# Patient Record
Sex: Female | Born: 1980 | Race: Black or African American | Hispanic: No | Marital: Single | Smoking: Never smoker
Health system: Southern US, Community
[De-identification: ages and names within clinical notes are randomized; demographics above are authoritative.]

## PROBLEM LIST (undated history)

## (undated) DIAGNOSIS — Z9889 Other specified postprocedural states: Secondary | ICD-10-CM

## (undated) DIAGNOSIS — N979 Female infertility, unspecified: Secondary | ICD-10-CM

## (undated) DIAGNOSIS — Z789 Other specified health status: Secondary | ICD-10-CM

## (undated) DIAGNOSIS — I10 Essential (primary) hypertension: Secondary | ICD-10-CM

## (undated) DIAGNOSIS — E559 Vitamin D deficiency, unspecified: Secondary | ICD-10-CM

## (undated) DIAGNOSIS — R7302 Impaired glucose tolerance (oral): Secondary | ICD-10-CM

## (undated) DIAGNOSIS — G43909 Migraine, unspecified, not intractable, without status migrainosus: Secondary | ICD-10-CM

## (undated) DIAGNOSIS — F329 Major depressive disorder, single episode, unspecified: Secondary | ICD-10-CM

## (undated) DIAGNOSIS — G4730 Sleep apnea, unspecified: Secondary | ICD-10-CM

## (undated) DIAGNOSIS — L989 Disorder of the skin and subcutaneous tissue, unspecified: Secondary | ICD-10-CM

## (undated) DIAGNOSIS — N939 Abnormal uterine and vaginal bleeding, unspecified: Secondary | ICD-10-CM

## (undated) DIAGNOSIS — R7303 Prediabetes: Secondary | ICD-10-CM

## (undated) DIAGNOSIS — R112 Nausea with vomiting, unspecified: Secondary | ICD-10-CM

## (undated) DIAGNOSIS — F419 Anxiety disorder, unspecified: Secondary | ICD-10-CM

## (undated) DIAGNOSIS — T8859XA Other complications of anesthesia, initial encounter: Secondary | ICD-10-CM

## (undated) HISTORY — DX: Vitamin D deficiency, unspecified: E55.9

## (undated) HISTORY — DX: Female infertility, unspecified: N97.9

## (undated) HISTORY — PX: DILATION AND CURETTAGE OF UTERUS: SHX78

## (undated) HISTORY — PX: BREAST SURGERY: SHX581

## (undated) HISTORY — PX: REDUCTION MAMMAPLASTY: SUR839

## (undated) HISTORY — PX: PELVIC LAPAROSCOPY: SHX162

## (undated) HISTORY — DX: Migraine, unspecified, not intractable, without status migrainosus: G43.909

## (undated) HISTORY — PX: NM MISC PROCEDURE: HXRAD923

---
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
## 1999-05-08 ENCOUNTER — Other Ambulatory Visit: Admission: RE | Admit: 1999-05-08 | Discharge: 1999-05-08 | Payer: Self-pay | Admitting: *Deleted

## 2009-10-12 HISTORY — PX: HERNIA REPAIR: SHX51

## 2012-10-12 DIAGNOSIS — N809 Endometriosis, unspecified: Secondary | ICD-10-CM

## 2012-10-12 HISTORY — DX: Endometriosis, unspecified: N80.9

## 2016-01-13 ENCOUNTER — Ambulatory Visit (HOSPITAL_COMMUNITY): Payer: Self-pay

## 2016-06-17 ENCOUNTER — Ambulatory Visit: Payer: Self-pay

## 2019-10-02 ENCOUNTER — Ambulatory Visit: Payer: Self-pay

## 2019-10-02 NOTE — Telephone Encounter (Signed)
Pt. Has a new pt. Appointment in January, but is requesting to be seen sooner if possible for a hernia. States she had hernia repair 2011. Over the weekend lifted some boxes and has pain and has noticed "a bulge" , midline and above her belly button. Please advise.Thanks.  Answer Assessment - Initial Assessment Questions 1. LOCATION: "Where does it hurt?"      Midline above belly button to the left 2. RADIATION: "Does the pain shoot anywhere else?" (e.g., chest, back)      No 3. ONSET: "When did the pain begin?" (e.g., minutes, hours or days ago)      3 days ago 4. SUDDEN: "Gradual or sudden onset?"     Sudden 5. PATTERN "Does the pain come and go, or is it constant?"    - If constant: "Is it getting better, staying the same, or worsening?"      (Note: Constant means the pain never goes away completely; most serious pain is constant and it progresses)     - If intermittent: "How long does it last?" "Do you have pain now?"     (Note: Intermittent means the pain goes away completely between bouts)     Comes and goes 6. SEVERITY: "How bad is the pain?"  (e.g., Scale 1-10; mild, moderate, or severe)   - MILD (1-3): doesn't interfere with normal activities, abdomen soft and not tender to touch    - MODERATE (4-7): interferes with normal activities or awakens from sleep, tender to touch    - SEVERE (8-10): excruciating pain, doubled over, unable to do any normal activities      Now - 6 7. RECURRENT SYMPTOM: "Have you ever had this type of abdominal pain before?" If so, ask: "When was the last time?" and "What happened that time?"      Yes 8. CAUSE: "What do you think is causing the abdominal pain?"     Hernia 9. RELIEVING/AGGRAVATING FACTORS: "What makes it better or worse?" (e.g., movement, antacids, bowel movement)     Eating makes it worse 10. OTHER SYMPTOMS: "Has there been any vomiting, diarrhea, constipation, or urine problems?"       Bulge in same area 11. PREGNANCY: "Is there any  chance you are pregnant?" "When was your last menstrual period?"       No  Protocols used: ABDOMINAL PAIN - [facility]

## 2019-10-02 NOTE — Telephone Encounter (Signed)
Spoke with patient. Advised with this type of issue she needs to seek medical attention at a UC. Advised her policy is that we don't see new patient for acute issues before their new patient appointment. Patient verbalized understanding and will go to UC.

## 2019-10-12 ENCOUNTER — Encounter (HOSPITAL_COMMUNITY): Payer: Self-pay

## 2019-10-12 ENCOUNTER — Other Ambulatory Visit: Payer: Self-pay

## 2019-10-12 ENCOUNTER — Other Ambulatory Visit (HOSPITAL_COMMUNITY)
Admission: RE | Admit: 2019-10-12 | Discharge: 2019-10-12 | Disposition: A | Discharge status: Home / Self Care | Payer: Managed Care, Other (non HMO) | Source: Ambulatory Visit | Attending: Surgery | Admitting: Surgery

## 2019-10-12 ENCOUNTER — Encounter (HOSPITAL_COMMUNITY)
Admission: RE | Admit: 2019-10-12 | Discharge: 2019-10-12 | Disposition: A | Discharge status: Home / Self Care | Payer: Managed Care, Other (non HMO) | Source: Ambulatory Visit | Attending: Surgery | Admitting: Surgery

## 2019-10-12 DIAGNOSIS — Z01818 Encounter for other preprocedural examination: Secondary | ICD-10-CM | POA: Diagnosis not present

## 2019-10-12 DIAGNOSIS — R9431 Abnormal electrocardiogram [ECG] [EKG]: Secondary | ICD-10-CM | POA: Diagnosis not present

## 2019-10-12 DIAGNOSIS — K439 Ventral hernia without obstruction or gangrene: Secondary | ICD-10-CM | POA: Diagnosis not present

## 2019-10-12 DIAGNOSIS — Z20828 Contact with and (suspected) exposure to other viral communicable diseases: Secondary | ICD-10-CM | POA: Diagnosis not present

## 2019-10-12 HISTORY — DX: Sleep apnea, unspecified: G47.30

## 2019-10-12 HISTORY — DX: Essential (primary) hypertension: I10

## 2019-10-12 HISTORY — DX: Prediabetes: R73.03

## 2019-10-12 LAB — BASIC METABOLIC PANEL
Anion gap: 9 (ref 5–15)
BUN: 11 mg/dL (ref 6–20)
CO2: 27 mmol/L (ref 22–32)
Calcium: 9.1 mg/dL (ref 8.9–10.3)
Chloride: 101 mmol/L (ref 98–111)
Creatinine, Ser: 0.6 mg/dL (ref 0.44–1.00)
GFR calc Af Amer: >60 mL/min (ref >60)
GFR calc non Af Amer: >60 mL/min (ref >60)
Glucose, Bld: 80 mg/dL (ref 70–99)
Potassium: 3.6 mmol/L (ref 3.5–5.1)
Sodium: 137 mmol/L (ref 135–145)

## 2019-10-12 LAB — CBC
HCT: 38.5 % (ref 36.0–46.0)
Hemoglobin: 12.6 g/dL (ref 12.0–15.0)
MCH: 31.1 pg (ref 26.0–34.0)
MCHC: 32.7 g/dL (ref 30.0–36.0)
MCV: 95.1 fL (ref 80.0–100.0)
Platelets: 360 10*3/uL (ref 150–400)
RBC: 4.05 MIL/uL (ref 3.87–5.11)
RDW: 14.6 % (ref 11.5–15.5)
WBC: 7.8 10*3/uL (ref 4.0–10.5)
nRBC: 0 % (ref 0.0–0.2)

## 2019-10-12 NOTE — Progress Notes (Signed)
PCP - Dr. Virl Cagey from [place] [facility] -   Chest x-ray -  EKG -  Stress Test -  ECHO -  Cardiac Cath -   Sleep Study -  CPAP -   Fasting Blood Sugar -  Checks Blood Sugar _____ times a day  Blood Thinner Instructions:N/A Aspirin Instructions: Last Dose:  Anesthesia review:   Patient denies shortness of breath, fever, cough and chest pain at PAT appointment   Patient verbalized understanding of instructions that were given to them at the PAT appointment. Patient was also instructed that they will need to review over the PAT instructions again at home before surgery.

## 2019-10-12 NOTE — Progress Notes (Signed)
Please place orders in epic for upcoming surgery . TY

## 2019-10-12 NOTE — Patient Instructions (Addendum)
DUE TO COVID-19 ONLY ONE VISITOR IS ALLOWED TO COME WITH YOU AND STAY IN THE WAITING ROOM ONLY DURING PRE OP AND PROCEDURE DAY OF SURGERY. THE 1 VISITOR MAY VISIT WITH YOU AFTER SURGERY IN YOUR PRIVATE ROOM DURING VISITING HOURS ONLY!  YOU NEED TO HAVE A COVID 19 TEST ON; 10/12/19 @___10 :45 AM____, THIS TEST MUST BE DONE BEFORE SURGERY, COME  [place], [address] , [id number].  ([place]) ONCE YOUR COVID TEST IS COMPLETED, PLEASE BEGIN THE QUARANTINE INSTRUCTIONS AS OUTLINED IN YOUR HANDOUT.                Angelica Young   Your procedure is scheduled on: 10/15/18   Report to [facility] Main  Entrance   Report to admitting at: 8:00 AM     Call this number if you have problems the morning of surgery [phone]    Remember: Do not eat food or drink liquids :After Midnight.  BRUSH YOUR TEETH MORNING OF SURGERY AND RINSE YOUR MOUTH OUT, NO CHEWING GUM CANDY OR MINTS.     Take these medicines the morning of surgery with A SIP OF WATER: fLUOXETINE                                 You may not have any metal on your body including hair pins and              piercings  Do not wear jewelry, make-up, lotions, powders or perfumes, deodorant             Do not wear nail polish on your fingernails.  Do not shave  48 hours prior to surgery.              Men may shave face and neck.   Do not bring valuables to the hospital. [place].  Contacts, dentures or bridgework may not be worn into surgery.  Leave suitcase in the car. After surgery it may be brought to your room.     Patients discharged the day of surgery will not be allowed to drive home. IF YOU ARE HAVING SURGERY AND GOING HOME THE SAME DAY, YOU MUST HAVE AN ADULT TO DRIVE YOU HOME AND BE WITH YOU FOR 24 HOURS. YOU MAY GO HOME BY TAXI OR UBER OR ORTHERWISE, BUT AN ADULT MUST ACCOMPANY YOU HOME AND STAY WITH YOU FOR 24 HOURS.  Name and phone number of  your driver:  Special Instructions: N/A              Please read over the following fact sheets you were given: _____________________________________________________________________             [facility] - Preparing for Surgery Before surgery, you can play an important role.  Because skin is not sterile, your skin needs to be as free of germs as possible.  You can reduce the number of germs on your skin by washing with CHG (chlorahexidine gluconate) soap before surgery.  CHG is an antiseptic cleaner which kills germs and bonds with the skin to continue killing germs even after washing. Please DO NOT use if you have an allergy to CHG or antibacterial soaps.  If your skin becomes reddened/irritated stop using the CHG and inform your nurse when you arrive at Short Stay. Do  not shave (including legs and underarms) for at least 48 hours prior to the first CHG shower.  You may shave your face/neck. Please follow these instructions carefully:  1.  Shower with CHG Soap the night before surgery and the  morning of Surgery.  2.  If you choose to wash your hair, wash your hair first as usual with your  normal  shampoo.  3.  After you shampoo, rinse your hair and body thoroughly to remove the  shampoo.                           4.  Use CHG as you would any other liquid soap.  You can apply chg directly  to the skin and wash                       Gently with a scrungie or clean washcloth.  5.  Apply the CHG Soap to your body ONLY FROM THE NECK DOWN.   Do not use on face/ open                           Wound or open sores. Avoid contact with eyes, ears mouth and genitals (private parts).                       Wash face,  Genitals (private parts) with your normal soap.             6.  Wash thoroughly, paying special attention to the area where your surgery  will be performed.  7.  Thoroughly rinse your body with warm water from the neck down.  8.  DO NOT shower/wash with your normal soap after using and  rinsing off  the CHG Soap.                9.  Pat yourself dry with a clean towel.            10.  Wear clean pajamas.            11.  Place clean sheets on your bed the night of your first shower and do not  sleep with pets. Day of Surgery : Do not apply any lotions/deodorants the morning of surgery.  Please wear clean clothes to the hospital/surgery center.  FAILURE TO FOLLOW THESE INSTRUCTIONS MAY RESULT IN THE CANCELLATION OF YOUR SURGERY PATIENT SIGNATURE_________________________________  NURSE SIGNATURE__________________________________  ________________________________________________________________________

## 2019-10-13 LAB — NOVEL CORONAVIRUS, NAA (HOSP ORDER, SEND-OUT TO REF LAB; TAT 18-24 HRS): SARS-CoV-2, NAA: NOT DETECTED

## 2019-10-15 ENCOUNTER — Ambulatory Visit: Payer: Self-pay | Admitting: Surgery

## 2019-10-15 NOTE — H&P (Signed)
Angelica Young Documented: 10/04/2019 2:34 PM Location: [place] Office Patient #: [id number] DOB: Mar 12, 1981 Married / Language: English / Race: Black or African American Female   History of Present Illness Molli Hazard B. Daphine Deutscher MD; 10/04/2019 3:13 PM) The patient is a 39 year old female who presents with an umbilical hernia. 39 year old patient of [facility] presents with painful umbilcal/ventral hernia. She had laparoscopy in 2011 for endometriosis. She had a small hernia that they closed primarily at that time. She got Covid in October and coughed for a month. She has been moving locally and carrying boxes she has developed pain and pinching in her supraumbilical region.   I described lap assisted ventral hernia repair with mesh. Will schedule as an outpatient at [facility] OR.    Past Surgical History Doristine Devoid, CMA; 10/04/2019 2:35 PM) Laparoscopic Inguinal Hernia Surgery  Left. Mammoplasty; Reduction  Bilateral.  Diagnostic Studies History Doristine Devoid, CMA; 10/04/2019 2:35 PM) Mammogram  within last year Pap Smear  1-5 years ago  Allergies Doristine Devoid, CMA; 10/04/2019 2:35 PM) Sulfa Antibiotics   Medication History Doristine Devoid, CMA; 10/04/2019 2:37 PM) Verapamil HCl (180MG  Capsule ER, Oral) Active. Chlorthalidone (25MG  Tablet, Oral) Active. FLUoxetine HCl (40MG  Capsule, Oral) Active. Medications Reconciled  Social History , CMA; 10/04/2019 2:35 PM) Alcohol use  Occasional alcohol use. Caffeine use  Coffee. No drug use  Tobacco use  Never smoker.  Family History , CMA; 10/04/2019 2:35 PM) Cerebrovascular Accident  Father. Heart disease in female family member before age 51  Hypertension  Father. Migraine Headache  Mother.  Pregnancy / Birth History Doristine Devoid, CMA; 10/04/2019 2:35 PM) Age at menarche  12 years. Contraceptive History  Oral contraceptives. Gravida   2 Maternal age  2-40 Para  0 Regular periods   Other Problems Doristine Devoid, CMA; 10/04/2019 2:35 PM) High blood pressure  Migraine Headache  Umbilical Hernia Repair     Review of Systems (Chemira Jones CMA; 10/04/2019 2:35 PM) General Not Present- Appetite Loss, Chills, Fatigue, Fever, Night Sweats, Weight Gain and Weight Loss. Skin Not Present- Change in Wart/Mole, Dryness, Hives, Jaundice, New Lesions, Non-Healing Wounds, Rash and Ulcer. HEENT Not Present- Earache, Hearing Loss, Hoarseness, Nose Bleed, Oral Ulcers, Ringing in the Ears, Seasonal Allergies, Sinus Pain, Sore Throat, Visual Disturbances, Wears glasses/contact lenses and Yellow Eyes. Respiratory Not Present- Bloody sputum, Chronic Cough, Difficulty Breathing, Snoring and Wheezing. Breast Not Present- Breast Mass, Breast Pain, Nipple Discharge and Skin Changes. Cardiovascular Not Present- Chest Pain, Difficulty Breathing Lying Down, Leg Cramps, Palpitations, Rapid Heart Rate, Shortness of Breath and Swelling of Extremities. Gastrointestinal Present- Abdominal Pain, Bloating and Gets full quickly at meals. Not Present- Bloody Stool, Change in Bowel Habits, Chronic diarrhea, Constipation, Difficulty Swallowing, Excessive gas, Hemorrhoids, Indigestion, Nausea, Rectal Pain and Vomiting. Female Genitourinary Not Present- Frequency, Nocturia, Painful Urination, Pelvic Pain and Urgency. Musculoskeletal Not Present- Back Pain, Joint Pain, Joint Stiffness, Muscle Pain, Muscle Weakness and Swelling of Extremities. Neurological Not Present- Decreased Memory, Fainting, Headaches, Numbness, Seizures, Tingling, Tremor, Trouble walking and Weakness. Psychiatric Not Present- Anxiety, Bipolar, Change in Sleep Pattern, Depression, Fearful and Frequent crying. Endocrine Not Present- Cold Intolerance, Excessive Hunger, Hair Changes, Heat Intolerance, Hot flashes and New Diabetes. Hematology Not Present- Blood Thinners, Easy Bruising,  Excessive bleeding, Gland problems, HIV and Persistent Infections.  Vitals (Chemira Jones CMA; 10/04/2019 2:35 PM) 10/04/2019 2:35 PM Weight: 195 lb Height: 62in Body Surface Area: 1.89 m Body Mass Index: 35.67 kg/m  Pulse: 90 (Regular)  BP: 140/80 (Sitting, Left Arm, Standard)       Physical Exam (Davin Muramoto B. Hassell Done MD; 10/04/2019 3:14 PM) General Note: WDWN AAF NAD HEENT unremarkable Neck supple Chest clear Heart SR Abdomen mildly obese with soft reducible ventral hernia just above the umbilicus. Defect a little large than fingertip Ext FROM     Assessment & Plan Rodman Key B. Hassell Done MD; 10/04/2019 3:15 PM) VENTRAL HERNIA (K43.9) Impression: She wants to move ahead with ventral hernia repair. I described that we would need to place mesh in the defect. Will schedule at [facility] main.  Signed by Pedro Earls, MD (10/04/2019 3:17 PM)

## 2019-10-16 ENCOUNTER — Ambulatory Visit (HOSPITAL_COMMUNITY): Payer: Managed Care, Other (non HMO) | Admitting: Physician Assistant

## 2019-10-16 ENCOUNTER — Ambulatory Visit (HOSPITAL_COMMUNITY)
Admission: RE | Admit: 2019-10-16 | Discharge: 2019-10-16 | Disposition: A | Discharge status: Home / Self Care | Payer: Managed Care, Other (non HMO) | Attending: Surgery | Admitting: Surgery

## 2019-10-16 ENCOUNTER — Encounter (HOSPITAL_COMMUNITY): Payer: Self-pay | Admitting: Surgery

## 2019-10-16 ENCOUNTER — Encounter (HOSPITAL_COMMUNITY)
Admission: RE | Disposition: A | Discharge status: Home / Self Care | Payer: Self-pay | Source: Home / Self Care | Attending: Surgery

## 2019-10-16 ENCOUNTER — Ambulatory Visit (HOSPITAL_COMMUNITY): Payer: Managed Care, Other (non HMO) | Admitting: Anesthesiology

## 2019-10-16 DIAGNOSIS — Z820 Family history of epilepsy and other diseases of the nervous system: Secondary | ICD-10-CM | POA: Diagnosis not present

## 2019-10-16 DIAGNOSIS — Z8249 Family history of ischemic heart disease and other diseases of the circulatory system: Secondary | ICD-10-CM | POA: Diagnosis not present

## 2019-10-16 DIAGNOSIS — Z823 Family history of stroke: Secondary | ICD-10-CM | POA: Insufficient documentation

## 2019-10-16 DIAGNOSIS — G4730 Sleep apnea, unspecified: Secondary | ICD-10-CM | POA: Diagnosis not present

## 2019-10-16 DIAGNOSIS — Z882 Allergy status to sulfonamides status: Secondary | ICD-10-CM | POA: Insufficient documentation

## 2019-10-16 DIAGNOSIS — I10 Essential (primary) hypertension: Secondary | ICD-10-CM | POA: Diagnosis not present

## 2019-10-16 DIAGNOSIS — R030 Elevated blood-pressure reading, without diagnosis of hypertension: Secondary | ICD-10-CM | POA: Diagnosis not present

## 2019-10-16 DIAGNOSIS — G43909 Migraine, unspecified, not intractable, without status migrainosus: Secondary | ICD-10-CM | POA: Insufficient documentation

## 2019-10-16 DIAGNOSIS — K436 Other and unspecified ventral hernia with obstruction, without gangrene: Secondary | ICD-10-CM | POA: Insufficient documentation

## 2019-10-16 HISTORY — PX: LAPAROSCOPIC ASSISTED VENTRAL HERNIA REPAIR: SHX6312

## 2019-10-16 LAB — PREGNANCY, URINE: Preg Test, Ur: NEGATIVE

## 2019-10-16 SURGERY — REPAIR, HERNIA, VENTRAL, LAPAROSCOPY-ASSISTED
Anesthesia: General | Site: Abdomen

## 2019-10-16 MED ORDER — ONDANSETRON HCL 4 MG/2ML IJ SOLN
INTRAMUSCULAR | Status: DC | PRN
  Administered 2019-10-16: 4 mg via INTRAVENOUS

## 2019-10-16 MED ORDER — LIDOCAINE 2% (20 MG/ML) 5 ML SYRINGE
INTRAMUSCULAR | Status: DC | PRN
  Administered 2019-10-16: 60 mg via INTRAVENOUS

## 2019-10-16 MED ORDER — CHLORHEXIDINE GLUCONATE CLOTH 2 % EX PADS: 6.0000 | MEDICATED_PAD | Freq: Once | CUTANEOUS | Status: DC

## 2019-10-16 MED ORDER — PROMETHAZINE HCL 25 MG/ML IJ SOLN: 6.2500 mg | INTRAMUSCULAR | Status: DC | PRN

## 2019-10-16 MED ORDER — HYDROMORPHONE HCL 1 MG/ML IJ SOLN
0.2500 mg | INTRAMUSCULAR | Status: DC | PRN
  Administered 2019-10-16 (×2): 0.5 mg via INTRAVENOUS

## 2019-10-16 MED ORDER — LACTATED RINGERS IV SOLN: INTRAVENOUS | Status: DC | PRN

## 2019-10-16 MED ORDER — SODIUM CHLORIDE (PF) 0.9 % IJ SOLN
INTRAMUSCULAR | Status: AC
  Filled 2019-10-16: qty 10

## 2019-10-16 MED ORDER — HYDROCODONE-ACETAMINOPHEN 5-325 MG PO TABS: 1.0000 | ORAL_TABLET | Freq: Four times a day (QID) | ORAL | 0 refills | Status: DC | PRN

## 2019-10-16 MED ORDER — 0.9 % SODIUM CHLORIDE (POUR BTL) OPTIME
TOPICAL | Status: DC | PRN
  Administered 2019-10-16: 1000 mL

## 2019-10-16 MED ORDER — SCOPOLAMINE 1 MG/3DAYS TD PT72
1.0000 | MEDICATED_PATCH | TRANSDERMAL | Status: DC
  Administered 2019-10-16: 1.5 mg via TRANSDERMAL
  Filled 2019-10-16: qty 1

## 2019-10-16 MED ORDER — CEFAZOLIN SODIUM-DEXTROSE 2-4 GM/100ML-% IV SOLN
2.0000 g | INTRAVENOUS | Status: AC
  Administered 2019-10-16: 11:00:00 2 g via INTRAVENOUS
  Filled 2019-10-16: qty 100

## 2019-10-16 MED ORDER — ROCURONIUM BROMIDE 10 MG/ML (PF) SYRINGE
PREFILLED_SYRINGE | INTRAVENOUS | Status: AC
  Filled 2019-10-16: qty 10

## 2019-10-16 MED ORDER — ONDANSETRON HCL 4 MG/2ML IJ SOLN
INTRAMUSCULAR | Status: AC
  Filled 2019-10-16: qty 2

## 2019-10-16 MED ORDER — BUPIVACAINE LIPOSOME 1.3 % IJ SUSP
INTRAMUSCULAR | Status: DC | PRN
  Administered 2019-10-16: 20 mL

## 2019-10-16 MED ORDER — MIDAZOLAM HCL 2 MG/2ML IJ SOLN
INTRAMUSCULAR | Status: AC
  Filled 2019-10-16: qty 2

## 2019-10-16 MED ORDER — TRIAMCINOLONE ACETONIDE 40 MG/ML IJ SUSP
INTRAMUSCULAR | Status: DC | PRN
  Administered 2019-10-16: 40 mg

## 2019-10-16 MED ORDER — LIDOCAINE 2% (20 MG/ML) 5 ML SYRINGE
INTRAMUSCULAR | Status: DC | PRN
  Administered 2019-10-16: 3.75 mL/h via INTRAVENOUS

## 2019-10-16 MED ORDER — ACETAMINOPHEN 10 MG/ML IV SOLN
INTRAVENOUS | Status: AC
  Filled 2019-10-16: qty 100

## 2019-10-16 MED ORDER — TRIAMCINOLONE ACETONIDE 40 MG/ML IJ SUSP
INTRAMUSCULAR | Status: AC
  Filled 2019-10-16: qty 1

## 2019-10-16 MED ORDER — EPHEDRINE SULFATE-NACL 50-0.9 MG/10ML-% IV SOSY
PREFILLED_SYRINGE | INTRAVENOUS | Status: DC | PRN
  Administered 2019-10-16: 10 mg via INTRAVENOUS

## 2019-10-16 MED ORDER — HEPARIN SODIUM (PORCINE) 5000 UNIT/ML IJ SOLN
5000.0000 [IU] | Freq: Once | INTRAMUSCULAR | Status: AC
  Administered 2019-10-16: 5000 [IU] via SUBCUTANEOUS
  Filled 2019-10-16: qty 1

## 2019-10-16 MED ORDER — KETOROLAC TROMETHAMINE 30 MG/ML IJ SOLN
INTRAMUSCULAR | Status: AC
  Filled 2019-10-16: qty 1

## 2019-10-16 MED ORDER — LACTATED RINGERS IV SOLN: INTRAVENOUS | Status: DC

## 2019-10-16 MED ORDER — FENTANYL CITRATE (PF) 100 MCG/2ML IJ SOLN
INTRAMUSCULAR | Status: DC | PRN
  Administered 2019-10-16: 50 ug via INTRAVENOUS
  Administered 2019-10-16: 100 ug via INTRAVENOUS

## 2019-10-16 MED ORDER — SUCCINYLCHOLINE CHLORIDE 20 MG/ML IJ SOLN
INTRAMUSCULAR | Status: DC | PRN
  Administered 2019-10-16: 140 mg via INTRAVENOUS

## 2019-10-16 MED ORDER — SUGAMMADEX SODIUM 200 MG/2ML IV SOLN
INTRAVENOUS | Status: DC | PRN
  Administered 2019-10-16: 300 mg via INTRAVENOUS

## 2019-10-16 MED ORDER — PROPOFOL 10 MG/ML IV BOLUS
INTRAVENOUS | Status: DC | PRN
  Administered 2019-10-16: 160 mg via INTRAVENOUS

## 2019-10-16 MED ORDER — BUPIVACAINE LIPOSOME 1.3 % IJ SUSP
20.0000 mL | Freq: Once | INTRAMUSCULAR | Status: DC
  Filled 2019-10-16: qty 20

## 2019-10-16 MED ORDER — HYDROMORPHONE HCL 1 MG/ML IJ SOLN
INTRAMUSCULAR | Status: AC
  Filled 2019-10-16: qty 1

## 2019-10-16 MED ORDER — SODIUM CHLORIDE (PF) 0.9 % IJ SOLN
INTRAMUSCULAR | Status: DC | PRN
  Administered 2019-10-16: 10 mL

## 2019-10-16 MED ORDER — KETOROLAC TROMETHAMINE 30 MG/ML IJ SOLN
30.0000 mg | Freq: Once | INTRAMUSCULAR | Status: AC | PRN
  Administered 2019-10-16: 15:00:00 30 mg via INTRAVENOUS

## 2019-10-16 MED ORDER — MIDAZOLAM HCL 5 MG/5ML IJ SOLN
INTRAMUSCULAR | Status: DC | PRN
  Administered 2019-10-16: 2 mg via INTRAVENOUS

## 2019-10-16 MED ORDER — ROCURONIUM BROMIDE 10 MG/ML (PF) SYRINGE
PREFILLED_SYRINGE | INTRAVENOUS | Status: DC | PRN
  Administered 2019-10-16: 40 mg via INTRAVENOUS
  Administered 2019-10-16: 20 mg via INTRAVENOUS
  Administered 2019-10-16: 10 mg via INTRAVENOUS

## 2019-10-16 MED ORDER — SUCCINYLCHOLINE CHLORIDE 200 MG/10ML IV SOSY
PREFILLED_SYRINGE | INTRAVENOUS | Status: AC
  Filled 2019-10-16: qty 10

## 2019-10-16 MED ORDER — SUGAMMADEX SODIUM 500 MG/5ML IV SOLN
INTRAVENOUS | Status: AC
  Filled 2019-10-16: qty 5

## 2019-10-16 MED ORDER — PROPOFOL 10 MG/ML IV BOLUS
INTRAVENOUS | Status: AC
  Filled 2019-10-16: qty 20

## 2019-10-16 MED ORDER — GLYCOPYRROLATE PF 0.2 MG/ML IJ SOSY
PREFILLED_SYRINGE | INTRAMUSCULAR | Status: DC | PRN
  Administered 2019-10-16: .2 mg via INTRAVENOUS

## 2019-10-16 MED ORDER — PHENYLEPHRINE 40 MCG/ML (10ML) SYRINGE FOR IV PUSH (FOR BLOOD PRESSURE SUPPORT)
PREFILLED_SYRINGE | INTRAVENOUS | Status: AC
  Filled 2019-10-16: qty 10

## 2019-10-16 MED ORDER — EPHEDRINE 5 MG/ML INJ
INTRAVENOUS | Status: AC
  Filled 2019-10-16: qty 10

## 2019-10-16 MED ORDER — FENTANYL CITRATE (PF) 250 MCG/5ML IJ SOLN
INTRAMUSCULAR | Status: AC
  Filled 2019-10-16: qty 5

## 2019-10-16 MED ORDER — DEXAMETHASONE SODIUM PHOSPHATE 10 MG/ML IJ SOLN
INTRAMUSCULAR | Status: DC | PRN
  Administered 2019-10-16: 10 mg via INTRAVENOUS

## 2019-10-16 SURGICAL SUPPLY — 36 items
ADH SKN CLS APL DERMABOND .7 (GAUZE/BANDAGES/DRESSINGS) ×1
APL PRP STRL LF DISP 70% ISPRP (MISCELLANEOUS) ×1
BINDER ABDOMINAL 12 ML 46-62 (SOFTGOODS) ×1 IMPLANT
CABLE HIGH FREQUENCY MONO STRZ (ELECTRODE) ×1 IMPLANT
CHLORAPREP W/TINT 26 (MISCELLANEOUS) ×2 IMPLANT
COVER SURGICAL LIGHT HANDLE (MISCELLANEOUS) ×2 IMPLANT
COVER WAND RF STERILE (DRAPES) IMPLANT
DECANTER SPIKE VIAL GLASS SM (MISCELLANEOUS) ×2 IMPLANT
DERMABOND ADVANCED (GAUZE/BANDAGES/DRESSINGS) ×1
DERMABOND ADVANCED .7 DNX12 (GAUZE/BANDAGES/DRESSINGS) ×1 IMPLANT
DEVICE SECURE STRAP 25 ABSORB (INSTRUMENTS) ×1 IMPLANT
DEVICE TROCAR PUNCTURE CLOSURE (ENDOMECHANICALS) ×2 IMPLANT
DISSECTOR BLUNT TIP ENDO 5MM (MISCELLANEOUS) IMPLANT
ELECT REM PT RETURN 15FT ADLT (MISCELLANEOUS) ×2 IMPLANT
GLOVE BIOGEL M 8.0 STRL (GLOVE) ×2 IMPLANT
GOWN STRL REUS W/TWL XL LVL3 (GOWN DISPOSABLE) ×6 IMPLANT
KIT BASIN OR (CUSTOM PROCEDURE TRAY) ×2 IMPLANT
KIT TURNOVER KIT A (KITS) IMPLANT
MARKER SKIN DUAL TIP RULER LAB (MISCELLANEOUS) ×2 IMPLANT
MESH VENTRALEX ST 1-7/10 CRC S (Mesh General) ×1 IMPLANT
NDL SPNL 22GX3.5 QUINCKE BK (NEEDLE) ×1 IMPLANT
NEEDLE SPNL 22GX3.5 QUINCKE BK (NEEDLE) ×2 IMPLANT
PENCIL SMOKE EVACUATOR (MISCELLANEOUS) ×1 IMPLANT
SET IRRIG TUBING LAPAROSCOPIC (IRRIGATION / IRRIGATOR) IMPLANT
SET TUBE SMOKE EVAC HIGH FLOW (TUBING) ×2 IMPLANT
SHEARS HARMONIC ACE PLUS 45CM (MISCELLANEOUS) ×1 IMPLANT
SLEEVE XCEL OPT CAN 5 100 (ENDOMECHANICALS) ×4 IMPLANT
STAPLER VISISTAT 35W (STAPLE) ×1 IMPLANT
SUT NOVA NAB DX-16 0-1 5-0 T12 (SUTURE) ×2 IMPLANT
SUT VIC AB 4-0 SH 18 (SUTURE) ×2 IMPLANT
TACKER 5MM HERNIA 3.5CML NAB (ENDOMECHANICALS) IMPLANT
TOWEL OR 17X26 10 PK STRL BLUE (TOWEL DISPOSABLE) ×2 IMPLANT
TOWEL OR NON WOVEN STRL DISP B (DISPOSABLE) ×2 IMPLANT
TRAY LAPAROSCOPIC (CUSTOM PROCEDURE TRAY) ×2 IMPLANT
TROCAR BLADELESS OPT 5 100 (ENDOMECHANICALS) ×2 IMPLANT
TROCAR XCEL NON-BLD 11X100MML (ENDOMECHANICALS) IMPLANT

## 2019-10-16 NOTE — Discharge Instructions (Signed)
Ventral Hernia  A ventral hernia is a bulge of tissue from inside the abdomen that pushes through a weak area of the muscles that form the front wall of the abdomen. The tissues inside the abdomen are inside a sac (peritoneum). These tissues include the small intestine, large intestine, and the fatty tissue that covers the intestines (omentum). Sometimes, the bulge that forms a hernia contains intestines. Other hernias contain only fat. Ventral hernias do not go away without surgical treatment. There are several types of ventral hernias. You may have:  A hernia at an incision site from previous abdominal surgery (incisional hernia).  A hernia just above the belly button (epigastric hernia), or at the belly button (umbilical hernia). These types of hernias can develop from heavy lifting or straining.  A hernia that comes and goes (reducible hernia). It may be visible only when you lift or strain. This type of hernia can be pushed back into the abdomen (reduced).  A hernia that traps abdominal tissue inside the hernia (incarcerated hernia). This type of hernia does not reduce.  A hernia that cuts off blood flow to the tissues inside the hernia (strangulated hernia). The tissues can start to die if this happens. This is a very painful bulge that cannot be reduced. A strangulated hernia is a medical emergency. What are the causes? This condition is caused by abdominal tissue putting pressure on an area of weakness in the abdominal muscles. What increases the risk? The following factors may make you more likely to develop this condition:  Being female.  Being 60 or older.  Being overweight or obese.  Having had previous abdominal surgery, especially if there was an infection after surgery.  Having had an injury to the abdominal wall.  Having had several pregnancies.  Having a buildup of fluid inside the abdomen (ascites). What are the signs or symptoms? The only symptom of a ventral hernia  may be a painless bulge in the abdomen. A reducible hernia may be visible only when you strain, cough, or lift. Other symptoms may include:  Dull pain.  A feeling of pressure. Signs and symptoms of a strangulated hernia may include:  Increasing pain.  Nausea and vomiting.  Pain when pressing on the hernia.  The skin over the hernia turning red or purple.  Constipation.  Blood in the stool (feces). How is this diagnosed? This condition may be diagnosed based on:  Your symptoms.  Your medical history.  A physical exam. You may be asked to cough or strain while standing. These actions increase the pressure inside your abdomen and force the hernia through the opening in your muscles. Your health care provider may try to reduce the hernia by pressing on it.  Imaging studies, such as an ultrasound or CT scan. How is this treated? This condition is treated with surgery. If you have a strangulated hernia, surgery is done as soon as possible. If your hernia is small and not incarcerated, you may be asked to lose some weight before surgery. Follow these instructions at home:  Follow instructions from your health care provider about eating or drinking restrictions.  If you are overweight, your health care provider may recommend that you increase your activity level and eat a healthier diet.  Do not lift anything that is heavier than 10 lb (4.5 kg).  Return to your normal activities as told by your health care provider. Ask your health care provider what activities are safe for you. You may need to avoid activities   that increase pressure on your hernia.  Take over-the-counter and prescription medicines only as told by your health care provider.  Keep all follow-up visits as told by your health care provider. This is important. Contact a health care provider if:  Your hernia gets larger.  Your hernia becomes painful. Get help right away if:  Your hernia becomes increasingly  painful.  You have pain along with any of the following: ? Changes in skin color in the area of the hernia. ? Nausea. ? Vomiting. ? Fever. Summary  A ventral hernia is a bulge of tissue from inside the abdomen that pushes through a weak area of the muscles that form the front wall of the abdomen.  This condition is treated with surgery, which may be urgent depending on your hernia.  Do not lift anything that is heavier than 10 lb (4.5 kg), and follow activity instructions from your health care provider. This information is not intended to replace advice given to you by your health care provider. Make sure you discuss any questions you have with your health care provider. Document Revised: 11/10/2017 Document Reviewed: 04/19/2017 Elsevier Patient Education  2020 [employer]vier Inc[employer]  

## 2019-10-16 NOTE — Op Note (Signed)
Angelica Young  05-26-1981 16 Oct 2019    PCP:  Kirkland Hun, MD   Surgeon: Wenda Low, MD, FACS  Asst:  none  Anes:  general  Preop Dx: Painful ventral hernia Postop Dx: Small incarcerated fat in 1 cm fascial defect  Procedure: Lap assisted ventral hernia repair with smallest Ventralex mesh patch Location Surgery: [facility] Complications: none  EBL:   minimal cc  Drains: none  Description of Procedure:  The patient was taken to OR 1 .  After anesthesia was administered and the patient was prepped  with Technicare and a timeout was performed.  Access to the abdomen was achieved with 5 mm Optiview through the left upper quadrant without difficulty.  A prior keloid in the LLQ (site of prior trocar) was excised and later injected with Kenalog.  The hernia was identified and pulled out of the defect revealing the fascial defect.  The prior supraumbilical incision was excised and the sac removed with the harmonic scalpel along with the herniated fat.  This was removed.  Ventralex mesh patch 1 1/4 inch was inserted and deployed nicely.  It was sutured to the fascial with 4 sutures of 0 Novafil and then tacked with the secure strap stapler.  The incisions were injected with Kenalog and Exparel and closed with 4-0 Monocryl and Dermabond.    The patient tolerated the procedure well and was taken to the PACU in stable condition.     Matt B. Daphine Deutscher, MD, [facility] Surgery, [place] [phone]

## 2019-10-16 NOTE — Transfer of Care (Signed)
Immediate Anesthesia Transfer of Care Note  Patient: Angelica Young  Procedure(s) Performed: LAPAROSCOPIC ASSISTED VENTRAL HERNIA REPAIR WITH MESH (N/A Abdomen)  Patient Location: PACU  Anesthesia Type:General  Level of Consciousness: awake, alert  and oriented  Airway & Oxygen Therapy: Patient Spontanous Breathing and Patient connected to face mask oxygen  Post-op Assessment: Report given to RN and Post -op Vital signs reviewed and stable  Post vital signs: Reviewed and stable  Last Vitals:  Vitals Value Taken Time  BP 142/92 10/16/19 1154  Temp    Pulse 92 10/16/19 1155  Resp 18 10/16/19 1156  SpO2 97 % 10/16/19 1155  Vitals shown include unvalidated device data.  Last Pain:  Vitals:   10/16/19 0858  TempSrc:   PainSc: 0-No pain         Complications: No apparent anesthesia complications

## 2019-10-16 NOTE — Anesthesia Postprocedure Evaluation (Signed)
Anesthesia Post Note  Patient: Miray Mancino  Procedure(s) Performed: LAPAROSCOPIC ASSISTED VENTRAL HERNIA REPAIR WITH MESH (N/A Abdomen)     Patient location during evaluation: PACU Anesthesia Type: General Level of consciousness: awake and alert Pain management: pain level controlled Vital Signs Assessment: post-procedure vital signs reviewed and stable Respiratory status: spontaneous breathing, nonlabored ventilation, respiratory function stable and patient connected to nasal cannula oxygen Cardiovascular status: blood pressure returned to baseline and stable Postop Assessment: no apparent nausea or vomiting Anesthetic complications: no    Last Vitals:  Vitals:   10/16/19 1345 10/16/19 1400  BP: 121/83 119/86  Pulse: 68 66  Resp: (!) 8 15  Temp:  37.1 C  SpO2: 96% 100%    Last Pain:  Vitals:   10/16/19 1400  TempSrc:   PainSc: 3                  Mats Jeanlouis S

## 2019-10-16 NOTE — Anesthesia Preprocedure Evaluation (Signed)
Anesthesia Evaluation  Patient identified by MRN, date of birth, ID band Patient awake    Reviewed: Allergy & Precautions, NPO status , Patient's Chart, lab work & pertinent test results  Airway Mallampati: II  TM Distance: >3 FB Neck ROM: Full    Dental no notable dental hx.    Pulmonary sleep apnea ,    Pulmonary exam normal breath sounds clear to auscultation       Cardiovascular hypertension, Normal cardiovascular exam Rhythm:Regular Rate:Normal     Neuro/Psych negative neurological ROS  negative psych ROS   GI/Hepatic negative GI ROS, Neg liver ROS,   Endo/Other  negative endocrine ROS  Renal/GU negative Renal ROS  negative genitourinary   Musculoskeletal negative musculoskeletal ROS (+)   Abdominal   Peds negative pediatric ROS (+)  Hematology negative hematology ROS (+)   Anesthesia Other Findings   Reproductive/Obstetrics negative OB ROS                             Anesthesia Physical Anesthesia Plan  ASA: II  Anesthesia Plan: General   Post-op Pain Management:    Induction: Intravenous  PONV Risk Score and Plan: 3 and Ondansetron, Dexamethasone, Treatment may vary due to age or medical condition and Midazolam  Airway Management Planned: Oral ETT  Additional Equipment:   Intra-op Plan:   Post-operative Plan: Extubation in OR  Informed Consent: I have reviewed the patients History and Physical, chart, labs and discussed the procedure including the risks, benefits and alternatives for the proposed anesthesia with the patient or authorized representative who has indicated his/her understanding and acceptance.     Dental advisory given  Plan Discussed with: CRNA and Surgeon  Anesthesia Plan Comments:         Anesthesia Quick Evaluation

## 2019-10-16 NOTE — Anesthesia Procedure Notes (Signed)
Procedure Name: Intubation Performed by: Gean Maidens, CRNA Pre-anesthesia Checklist: Patient identified, Emergency Drugs available, Suction available, Patient being monitored and Timeout performed Patient Re-evaluated:Patient Re-evaluated prior to induction Oxygen Delivery Method: Circle system utilized Preoxygenation: Pre-oxygenation with 100% oxygen Induction Type: IV induction Ventilation: Mask ventilation without difficulty Laryngoscope Size: Mac and 4 Grade View: Grade I Tube type: Oral Tube size: 7.0 mm Number of attempts: 1 Airway Equipment and Method: Stylet Placement Confirmation: ETT inserted through vocal cords under direct vision,  positive ETCO2 and breath sounds checked- equal and bilateral Secured at: 21 cm Tube secured with: Tape Dental Injury: Teeth and Oropharynx as per pre-operative assessment

## 2019-10-16 NOTE — Progress Notes (Signed)
Pt states she experiences "bad nausea and vomiting" when she takes hydrocodone. Called Dr. Daphine Deutscher, notified that pt wishes to switch to a different prescription. Dr. Daphine Deutscher advised pt to try taking the vicodin on a full stomach if needed, and advised to begin with just tylenol - stated that narcotic pain meds may not be necessary due to numbing medication at the incision sites. Pt is to utilize abdominal binder, ice and OTC medications first, and if needed may call the office to request another narcotic pain medication.  Pt updated and agreeable to this plan. Pt discharged home in stable condition accompanied by mother.  Ardyth Gal, RN 10/16/2019

## 2019-10-16 NOTE — Interval H&P Note (Signed)
History and Physical Interval Note:  10/16/2019 9:42 AM  Angelica Young  has presented today for surgery, with the diagnosis of ventral hernia.  The various methods of treatment have been discussed with the patient and family. After consideration of risks, benefits and other options for treatment, the patient has consented to  Procedure(s): LAPAROSCOPIC ASSISTED VENTRAL HERNIA REPAIR WITH MESH (N/A) as a surgical intervention.  The patient's history has been reviewed, patient examined, no change in status, stable for surgery.  I have reviewed the patient's chart and labs.  Questions were answered to the patient's satisfaction.     Valarie Merino

## 2019-10-17 ENCOUNTER — Encounter: Payer: Self-pay | Admitting: *Deleted

## 2019-10-27 ENCOUNTER — Ambulatory Visit (HOSPITAL_COMMUNITY)
Admission: EM | Admit: 2019-10-27 | Discharge: 2019-10-27 | Disposition: A | Discharge status: Home / Self Care | Payer: Managed Care, Other (non HMO) | Attending: Urgent Care | Admitting: Urgent Care

## 2019-10-27 ENCOUNTER — Other Ambulatory Visit: Payer: Self-pay

## 2019-10-27 ENCOUNTER — Encounter (HOSPITAL_COMMUNITY): Payer: Self-pay

## 2019-10-27 DIAGNOSIS — R102 Pelvic and perineal pain: Secondary | ICD-10-CM | POA: Diagnosis not present

## 2019-10-27 DIAGNOSIS — Z3202 Encounter for pregnancy test, result negative: Secondary | ICD-10-CM | POA: Diagnosis not present

## 2019-10-27 DIAGNOSIS — N939 Abnormal uterine and vaginal bleeding, unspecified: Secondary | ICD-10-CM

## 2019-10-27 DIAGNOSIS — N809 Endometriosis, unspecified: Secondary | ICD-10-CM

## 2019-10-27 LAB — POCT URINALYSIS DIP (DEVICE)
Bilirubin Urine: NEGATIVE
Glucose, UA: NEGATIVE mg/dL
Ketones, ur: NEGATIVE mg/dL
Leukocytes,Ua: NEGATIVE
Nitrite: NEGATIVE
Protein, ur: NEGATIVE mg/dL
Specific Gravity, Urine: 1.01 (ref 1.005–1.030)
Urobilinogen, UA: 0.2 mg/dL (ref 0.0–1.0)
pH: 6.5 (ref 5.0–8.0)

## 2019-10-27 LAB — POC URINE PREG, ED: Preg Test, Ur: NEGATIVE

## 2019-10-27 LAB — POCT PREGNANCY, URINE: Preg Test, Ur: NEGATIVE

## 2019-10-27 MED ORDER — MEGESTROL ACETATE 40 MG PO TABS: 80.0000 mg | ORAL_TABLET | Freq: Every day | ORAL | 0 refills | Status: DC

## 2019-10-27 NOTE — ED Triage Notes (Signed)
Pt presents to UC with vaginal bleeding and lower abdominal pain x 4 days. Pt states she is passing large blood clots. Pt think this is related to endometriosis.

## 2019-10-27 NOTE — ED Provider Notes (Signed)
[facility]   MRN: [id number] DOB: 1981-10-01  Subjective:   Angelica Young is a 39 y.o. female presenting for 4 day history of heavy vaginal bleeding, intermittent moderate pelvic pains. Has a hx of endometriosis. Also had a recent laparoscopic ventral hernia repair. Has used progesterone medications in the past successfully to help stop bleeding.   No current facility-administered medications for this encounter.  Current Outpatient Medications:  .  chlorthalidone (HYGROTON) 25 MG tablet, Take 25 mg by mouth daily., Disp: , Rfl:  .  FLUoxetine (PROZAC) 40 MG capsule, Take 40 mg by mouth daily., Disp: , Rfl:  .  HYDROcodone-acetaminophen (NORCO/VICODIN) 5-325 MG tablet, Take 1 tablet by mouth every 6 (six) hours as needed for moderate pain., Disp: 15 tablet, Rfl: 0 .  Multiple Vitamin (MULTIVITAMIN PO), Take 30 mLs by mouth daily. Source of life, Disp: , Rfl:  .  omega-3 acid ethyl esters (LOVAZA) 1 g capsule, Take 1 g by mouth daily., Disp: , Rfl:  .  verapamil (CALAN) 80 MG tablet, Take 80 mg by mouth at bedtime., Disp: , Rfl:    Allergies  Allergen Reactions  . Vicodin [Hydrocodone-Acetaminophen] Nausea And Vomiting  . Sulfa Antibiotics Rash    Past Medical History:  Diagnosis Date  . Depression   . Endometriosis 2014  . Hypertension   . Pre-diabetes   . Sleep apnea      Past Surgical History:  Procedure Laterality Date  . BREAST SURGERY     reduction 2009  . HERNIA REPAIR  2011  . LAPAROSCOPIC ASSISTED VENTRAL HERNIA REPAIR N/A 10/16/2019   Procedure: LAPAROSCOPIC ASSISTED VENTRAL HERNIA REPAIR WITH MESH;  Surgeon: Luretha Murphy, MD;  Location: [facility];  Service: General;  Laterality: N/A;  . NM MISC PROCEDURE  [id number]   miscarried     History reviewed. No pertinent family history.  Social History   Tobacco Use  . Smoking status: Never Smoker  . Smokeless tobacco: Never Used  Substance Use Topics  . Alcohol use: Yes    Alcohol/week: 1.0 - 2.0  standard drinks    Types: 1 - 2 Glasses of wine per week    Comment: occass.  . Drug use: Never    ROS   Objective:   Vitals: BP 124/88 (BP Location: Right Arm)   Pulse 93   Temp 98.8 F (37.1 C) (Oral)   Resp 16   LMP 10/11/2019   SpO2 100%   Physical Exam Constitutional:      General: She is not in acute distress.    Appearance: Normal appearance. She is well-developed and normal weight. She is not ill-appearing, toxic-appearing or diaphoretic.  HENT:     Head: Normocephalic and atraumatic.     Right Ear: External ear normal.     Left Ear: External ear normal.     Nose: Nose normal.     Mouth/Throat:     Mouth: Mucous membranes are moist.     Pharynx: Oropharynx is clear.  Eyes:     General: No scleral icterus.    Extraocular Movements: Extraocular movements intact.     Pupils: Pupils are equal, round, and reactive to light.  Cardiovascular:     Rate and Rhythm: Normal rate and regular rhythm.     Heart sounds: Normal heart sounds. No murmur. No friction rub. No gallop.   Pulmonary:     Effort: Pulmonary effort is normal. No respiratory distress.     Breath sounds: Normal breath sounds. No stridor. No wheezing,  rhonchi or rales.  Abdominal:     General: Bowel sounds are normal. There is no distension.     Palpations: Abdomen is soft. There is no mass.     Tenderness: There is no abdominal tenderness. There is no right CVA tenderness, left CVA tenderness, guarding or rebound.  Skin:    General: Skin is warm and dry.     Coloration: Skin is not pale.     Findings: No rash.  Neurological:     General: No focal deficit present.     Mental Status: She is alert and oriented to person, place, and time.  Psychiatric:        Mood and Affect: Mood normal.        Behavior: Behavior normal.        Thought Content: Thought content normal.        Judgment: Judgment normal.     Results for orders placed or performed during the hospital encounter of 10/27/19 (from the  past 24 hour(s))  POCT urinalysis dip (device)     Status: Abnormal   Collection Time: 10/27/19  4:03 PM  Result Value Ref Range   Glucose, UA NEGATIVE NEGATIVE mg/dL   Bilirubin Urine NEGATIVE NEGATIVE   Ketones, ur NEGATIVE NEGATIVE mg/dL   Specific Gravity, Urine 1.010 1.005 - 1.030   Hgb urine dipstick MODERATE (A) NEGATIVE   pH 6.5 5.0 - 8.0   Protein, ur NEGATIVE NEGATIVE mg/dL   Urobilinogen, UA 0.2 0.0 - 1.0 mg/dL   Nitrite NEGATIVE NEGATIVE   Leukocytes,Ua NEGATIVE NEGATIVE  Pregnancy, urine POC     Status: None   Collection Time: 10/27/19  4:09 PM  Result Value Ref Range   Preg Test, Ur NEGATIVE NEGATIVE  POC urine preg, ED (not at [facility])     Status: None   Collection Time: 10/27/19  4:09 PM  Result Value Ref Range   Preg Test, Ur NEGATIVE NEGATIVE    Assessment and Plan :   1. Vaginal bleeding   2. Pelvic pain   3. Endometriosis     Suspect uterine bleeding related to endometriosis. Surgical scars healing well and have low suspicion for complication related to hernia repair.  Vital signs and physical exam findings stable for discharge.  Will use 5-day course of Megace. [facility]erred to [facility] [place] for gynecologic care. Counseled patient on potential for adverse effects with medications prescribed/recommended today, ER and return-to-clinic precautions discussed, patient verbalized understanding.    Jaynee Eagles, PA-C 10/27/19 1740

## 2019-11-03 ENCOUNTER — Ambulatory Visit (INDEPENDENT_AMBULATORY_CARE_PROVIDER_SITE_OTHER): Payer: Managed Care, Other (non HMO) | Admitting: Internal Medicine

## 2019-11-03 ENCOUNTER — Encounter: Payer: Self-pay | Admitting: Internal Medicine

## 2019-11-03 ENCOUNTER — Other Ambulatory Visit: Payer: Self-pay

## 2019-11-03 VITALS — BP 110/80 | HR 85 | Temp 97.5°F | Ht 62.0 in | Wt 185.0 lb

## 2019-11-03 DIAGNOSIS — R7302 Impaired glucose tolerance (oral): Secondary | ICD-10-CM | POA: Diagnosis not present

## 2019-11-03 DIAGNOSIS — F32A Depression, unspecified: Secondary | ICD-10-CM | POA: Insufficient documentation

## 2019-11-03 DIAGNOSIS — I10 Essential (primary) hypertension: Secondary | ICD-10-CM | POA: Diagnosis not present

## 2019-11-03 DIAGNOSIS — N809 Endometriosis, unspecified: Secondary | ICD-10-CM

## 2019-11-03 DIAGNOSIS — F329 Major depressive disorder, single episode, unspecified: Secondary | ICD-10-CM

## 2019-11-03 DIAGNOSIS — E669 Obesity, unspecified: Secondary | ICD-10-CM

## 2019-11-03 DIAGNOSIS — G43909 Migraine, unspecified, not intractable, without status migrainosus: Secondary | ICD-10-CM

## 2019-11-03 DIAGNOSIS — Z124 Encounter for screening for malignant neoplasm of cervix: Secondary | ICD-10-CM

## 2019-11-03 NOTE — Patient Instructions (Signed)
-  Nice seeing you today!!  -Schedule a 6 month follow up for your physical. Please come in fasting that day.  -Referrals to GYN and to the Headache Clinic today.

## 2019-11-03 NOTE — Progress Notes (Signed)
New Patient Office Visit     This visit occurred during the SARS-CoV-2 public health emergency.  Safety protocols were in place, including screening questions prior to the visit, additional usage of staff PPE, and extensive cleaning of exam room while observing appropriate contact time as indicated for disinfecting solutions.    CC/Reason for Visit: Establish care, discuss chronic medical conditions Previous PCP: In [place] Last Visit: July 2020  HPI: Angelica Young is a 39 y.o. female who is coming in today for the above mentioned reasons. Past Medical History is significant for: Hypertension that has been well controlled on chlorthalidone and Calan.  Also history of endometriosis who has had several laparoscopies, impaired glucose tolerance with an A1c of 5.8 last year, history of migraine headaches on Excedrin, Imitrex and Phenergan.  She gets about 2-3 headaches a week, has never been on a migraine preventative.  She did develop Covid in October.  Her past surgical history is significant for recent umbilical hernia repair, breast reduction and she had 2 miscarriages for which she had D&Cs.  She does not smoke, does not drink, she has allergies to sulfa drugs which cause a rash.  Her family history significant for mother and maternal grandmother with migraines maternal aunt with breast cancer diagnosed at age 74 and a father with hypertension and coronary artery disease who unfortunately passed 3 years ago.  She has no acute complaints today.   Past Medical/Surgical History: Past Medical History:  Diagnosis Date  . Depression   . Endometriosis 2014  . Hypertension   . IGT (impaired glucose tolerance)   . Migraines   . Pre-diabetes   . Sleep apnea     Past Surgical History:  Procedure Laterality Date  . BREAST SURGERY     reduction 2009  . HERNIA REPAIR  2011  . LAPAROSCOPIC ASSISTED VENTRAL HERNIA REPAIR N/A 10/16/2019   Procedure: LAPAROSCOPIC ASSISTED VENTRAL HERNIA REPAIR  WITH MESH;  Surgeon: Johnathan Hausen, MD;  Location: [facility];  Service: General;  Laterality: N/A;  . [place]  2018,2019   miscarried     Social History:  reports that she has never smoked. She has never used smokeless tobacco. She reports current alcohol use of about 1.0 - 2.0 standard drinks of alcohol per week. She reports that she does not use drugs.  Allergies: Allergies  Allergen Reactions  . Vicodin [Hydrocodone-Acetaminophen] Nausea And Vomiting  . Sulfa Antibiotics Rash    Family History:  Family History  Problem Relation Age of Onset  . Migraines Mother   . CAD Father   . Hypertension Father   . Migraines Maternal Grandmother      Current Outpatient Medications:  .  chlorthalidone (HYGROTON) 25 MG tablet, Take 25 mg by mouth daily., Disp: , Rfl:  .  FLUoxetine (PROZAC) 40 MG capsule, Take 40 mg by mouth daily., Disp: , Rfl:  .  Multiple Vitamin (MULTIVITAMIN PO), Take 30 mLs by mouth daily. Source of life, Disp: , Rfl:  .  omega-3 acid ethyl esters (LOVAZA) 1 g capsule, Take 1 g by mouth daily., Disp: , Rfl:  .  verapamil (CALAN) 80 MG tablet, Take 80 mg by mouth at bedtime., Disp: , Rfl:   Review of Systems:  Constitutional: Denies fever, chills, diaphoresis, appetite change and fatigue.  HEENT: Denies photophobia, eye pain, redness, hearing loss, ear pain, congestion, sore throat, rhinorrhea, sneezing, mouth sores, trouble swallowing, neck pain, neck stiffness and tinnitus.   Respiratory: Denies SOB, DOE, cough,  chest tightness,  and wheezing.   Cardiovascular: Denies chest pain, palpitations and leg swelling.  Gastrointestinal: Denies nausea, vomiting, abdominal pain, diarrhea, constipation, blood in stool and abdominal distention.  Genitourinary: Denies dysuria, urgency, frequency, hematuria, flank pain and difficulty urinating.  Endocrine: Denies: hot or cold intolerance, sweats, changes in hair or nails, polyuria, polydipsia. Musculoskeletal: Denies  myalgias, back pain, joint swelling, arthralgias and gait problem.  Skin: Denies pallor, rash and wound.  Neurological: Denies dizziness, seizures, syncope, weakness, light-headedness, numbness and headaches.  Hematological: Denies adenopathy. Easy bruising, personal or family bleeding history  Psychiatric/Behavioral: Denies suicidal ideation, mood changes, confusion, nervousness, sleep disturbance and agitation    Physical Exam: Vitals:   11/03/19 0740  BP: 110/80  Pulse: 85  Temp: (!) 97.5 F (36.4 C)  TempSrc: Temporal  SpO2: 98%  Weight: 185 lb (83.9 kg)  Height: 5\' 2"  (1.575 m)   Body mass index is 33.84 kg/m.  Constitutional: NAD, calm, comfortable Eyes: PERRL, lids and conjunctivae normal ENMT: Mucous membranes are moist.  Respiratory: clear to auscultation bilaterally, no wheezing, no crackles. Normal respiratory effort. No accessory muscle use.  Cardiovascular: Regular rate and rhythm, no murmurs / rubs / gallops. No extremity edema.  Neurologic: Grossly intact and nonfocal Psychiatric: Normal judgment and insight. Alert and oriented x 3. Normal mood.    Impression and Plan:  Screening for cervical cancer  Endometriosis - Plan: Ambulatory referral to Gynecology  Essential hypertension -Well-controlled on current medication, does not need refills today.  Reactive depression -She has been on fluoxetine 40 mg and she is going through a divorce, she states she does not want to be on it long-term.  IGT (impaired glucose tolerance) -Check A1c when she returns for physical.  Migraine without status migrainosus, not intractable, unspecified migraine type -I will refer to headache clinic today, believe she will benefit from being on a migraine preventative medication as she is getting 2-3 migraines a week.  Obesity (BMI 30.0-34.9) -Discussed healthy lifestyle, including increased physical activity and better food choices to promote weight loss.     Patient  Instructions  -Nice seeing you today!!  -Schedule a 6 month follow up for your physical. Please come in fasting that day.  -Referrals to GYN and to the Headache Clinic today.     02-13-1993, MD [facility]

## 2019-11-27 ENCOUNTER — Other Ambulatory Visit: Payer: Self-pay

## 2019-11-28 ENCOUNTER — Encounter: Payer: Self-pay | Admitting: Obstetrics and Gynecology

## 2019-11-28 ENCOUNTER — Ambulatory Visit (INDEPENDENT_AMBULATORY_CARE_PROVIDER_SITE_OTHER): Payer: Managed Care, Other (non HMO) | Admitting: Obstetrics and Gynecology

## 2019-11-28 VITALS — BP 122/82 | Ht 62.0 in | Wt 193.0 lb

## 2019-11-28 DIAGNOSIS — Z124 Encounter for screening for malignant neoplasm of cervix: Secondary | ICD-10-CM | POA: Diagnosis not present

## 2019-11-28 DIAGNOSIS — N809 Endometriosis, unspecified: Secondary | ICD-10-CM | POA: Diagnosis not present

## 2019-11-28 DIAGNOSIS — Z01419 Encounter for gynecological examination (general) (routine) without abnormal findings: Secondary | ICD-10-CM

## 2019-11-28 NOTE — Progress Notes (Signed)
Angelica Young 02-13-1981 [id number]  SUBJECTIVE:  39 y.o. G33P0020 female for annual routine gynecologic exam and Pap smear. Moving back to [place] from [place], [place]. Grew up here and has family here.  Going through a divorce currently. Unfortunately had miscarriages in 2018 and 2019.  Was diagnosed with endometriosis in 2014 by laparoscopy.  States she has had 4 laparoscopies since.  Periods are regular and monthly but heavy with clots.  She recently had to go to Urgent Care due to heavy period and pains, prescribed Megace which helped control the symptoms.  Current Outpatient Medications  Medication Sig Dispense Refill  . chlorthalidone (HYGROTON) 25 MG tablet Take 25 mg by mouth daily.    Marland Kitchen FLUoxetine (PROZAC) 40 MG capsule Take 40 mg by mouth daily.    . Multiple Vitamin (MULTIVITAMIN PO) Take 30 mLs by mouth daily. Source of life    . omega-3 acid ethyl esters (LOVAZA) 1 g capsule Take 1 g by mouth daily.    . verapamil (CALAN) 80 MG tablet Take 80 mg by mouth at bedtime.    Marland Kitchen VITAMIN D PO Take by mouth.     No current facility-administered medications for this visit.   Allergies: Vicodin [hydrocodone-acetaminophen] and Sulfa antibiotics  Patient's last menstrual period was 11/16/2019.  Past medical history,surgical history, problem list, medications, allergies, family history and social history were all reviewed and documented as reviewed in the EPIC chart.  ROS:  Feeling well. No dyspnea or chest pain on exertion.  No abdominal pain, change in bowel habits, black or bloody stools.  No urinary tract symptoms. GYN ROS: normal menses, + heavy abnormal bleeding, + pelvic pain, no discharge, no breast pain or new or enlarging lumps on self exam. No neurological complaints.   OBJECTIVE:  BP 122/82   Ht 5\' 2"  (1.575 m)   Wt 193 lb (87.5 kg)   LMP 11/16/2019   BMI 35.30 kg/m  The patient appears well, alert, oriented x 3, in no distress. ENT normal.  Neck supple. No cervical  or supraclavicular adenopathy or thyromegaly.  Lungs are clear, good air entry, no wheezes, rhonchi or rales. S1 and S2 normal, no murmurs, regular rate and rhythm.  Abdomen soft without tenderness, guarding, mass or organomegaly.  Neurological is normal, no focal findings.  BREAST EXAM: breasts appear normal, no suspicious masses, no skin or nipple changes or axillary nodes  PELVIC EXAM: VULVA: normal appearing vulva with no masses, tenderness or lesions, VAGINA: normal appearing vagina with normal color and discharge, no lesions, CERVIX: normal appearing cervix without discharge or lesions, UTERUS: uterus is normal size, shape, consistency and nontender, ADNEXA: normal adnexa in size, nontender and no masses  Chaperone: 01/14/2020 present during the examination  ASSESSMENT:  39 y.o. G2P0020 here for annual gynecologic exam  PLAN:   1. History of endometriosis reportedly diagnosed by laparoscopy in 2014 in 2015.  I do not have records available to review today.  We briefly discussed management of endometriosis entails hormonal contraception and judicious use of surgery as needed.  Recently finished Megace therapy which did help her with a recent heavy period.  She has been on Lupron in the past and had a hard time with the side effects after the first 3 months of use.  She does have hypertension controlled with medication so need to be mindful of that with developing a contraception management plan.  I invited her to return to have further discussion regarding endometriosis and management options. 2. Pap smear  2018.  Pap smear is obtained today. 3. Mammogram 10/2019.  Normal breast exam today.  She is reminded to schedule an annual mammogram this year when due. 4. Contraception:  None currently as not sexually active.  May need to resume for endometriosis purposes as described above.   5. Health maintenance.  No labs today as she normally has these completed with her primary care provider.      Return annually or sooner, prn.  Joseph Pierini MD  11/28/19

## 2019-11-28 NOTE — Patient Instructions (Signed)
We will let you know about the Pap smear results when available Please let us know if you would like to come back to discuss endometriosis and treatments/birth control options at another appointment

## 2019-11-29 LAB — PAP IG W/ RFLX HPV ASCU

## 2019-11-29 LAB — BASIC METABOLIC PANEL
BUN: 9 (ref 4–21)
CO2: 31 — AB (ref 13–22)
Chloride: 101 (ref 99–108)
Creatinine: 0.6 (ref 0.5–1.1)
Glucose: 91
Potassium: 4 (ref 3.4–5.3)

## 2019-11-29 LAB — COMPREHENSIVE METABOLIC PANEL
Albumin: 3.3 — AB (ref 3.5–5.0)
Calcium: 9.1 (ref 8.7–10.7)

## 2019-11-29 LAB — LIPID PANEL
Cholesterol: 159 (ref 0–200)
HDL: 53 (ref 35–70)
LDL Cholesterol: 85
Triglycerides: 108 (ref 40–160)

## 2019-11-29 LAB — HEPATIC FUNCTION PANEL
ALT: 26 (ref 7–35)
AST: 34 (ref 13–35)
Alkaline Phosphatase: 56 (ref 25–125)
Bilirubin, Total: 0.4

## 2019-11-29 LAB — POCT ERYTHROCYTE SEDIMENTATION RATE, NON-AUTOMATED: Sed Rate: 10

## 2019-11-29 NOTE — Progress Notes (Signed)
Please let Curstin know that her Pap smear was normal.

## 2019-12-14 ENCOUNTER — Institutional Professional Consult (permissible substitution): Payer: Managed Care, Other (non HMO) | Admitting: Internal Medicine

## 2019-12-21 ENCOUNTER — Ambulatory Visit (INDEPENDENT_AMBULATORY_CARE_PROVIDER_SITE_OTHER): Payer: Managed Care, Other (non HMO) | Admitting: Family Medicine

## 2019-12-21 ENCOUNTER — Other Ambulatory Visit: Payer: Self-pay

## 2019-12-21 ENCOUNTER — Encounter (INDEPENDENT_AMBULATORY_CARE_PROVIDER_SITE_OTHER): Payer: Self-pay

## 2019-12-21 ENCOUNTER — Encounter (INDEPENDENT_AMBULATORY_CARE_PROVIDER_SITE_OTHER): Payer: Self-pay | Admitting: Family Medicine

## 2019-12-21 VITALS — BP 115/80 | HR 77 | Temp 98.0°F | Ht 62.0 in | Wt 182.0 lb

## 2019-12-21 DIAGNOSIS — E559 Vitamin D deficiency, unspecified: Secondary | ICD-10-CM

## 2019-12-21 DIAGNOSIS — I10 Essential (primary) hypertension: Secondary | ICD-10-CM

## 2019-12-21 DIAGNOSIS — R5383 Other fatigue: Secondary | ICD-10-CM

## 2019-12-21 DIAGNOSIS — Z0289 Encounter for other administrative examinations: Secondary | ICD-10-CM

## 2019-12-21 DIAGNOSIS — G4733 Obstructive sleep apnea (adult) (pediatric): Secondary | ICD-10-CM

## 2019-12-21 DIAGNOSIS — G43809 Other migraine, not intractable, without status migrainosus: Secondary | ICD-10-CM

## 2019-12-21 DIAGNOSIS — R7303 Prediabetes: Secondary | ICD-10-CM

## 2019-12-21 DIAGNOSIS — R0602 Shortness of breath: Secondary | ICD-10-CM

## 2019-12-21 DIAGNOSIS — Z6833 Body mass index (BMI) 33.0-33.9, adult: Secondary | ICD-10-CM

## 2019-12-21 DIAGNOSIS — E669 Obesity, unspecified: Secondary | ICD-10-CM

## 2019-12-21 DIAGNOSIS — Z9189 Other specified personal risk factors, not elsewhere classified: Secondary | ICD-10-CM

## 2019-12-21 DIAGNOSIS — F3289 Other specified depressive episodes: Secondary | ICD-10-CM | POA: Diagnosis not present

## 2019-12-21 DIAGNOSIS — N809 Endometriosis, unspecified: Secondary | ICD-10-CM

## 2019-12-21 NOTE — Progress Notes (Signed)
Chief Complaint:   OBESITY Angelica Young (MR# [id number]) is a 39 y.o. female who presents for evaluation and treatment of obesity and related comorbidities. Current BMI is Body mass index is 33.29 kg/m. Angelica Young has been struggling with her weight for many years and has been unsuccessful in either losing weight, maintaining weight loss, or reaching her healthy weight goal.  Angelica Young is currently in the action stage of change and ready to dedicate time achieving and maintaining a healthier weight. Angelica Young is interested in becoming our patient and working on intensive lifestyle modifications including (but not limited to) diet and exercise for weight loss.  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  She currently lives with her parents. She says time constraints are a barrier to cooking.  She craves sweets.  She has lost 12 pounds since moving to [place].  She reports that she walks for 30 minutes 4 days a week for exercise.  She recently had labs performed at [facility].  Angelica Young's habits were reviewed today and are as follows: Her family eats meals together, she thinks her family will eat healthier with her, her desired weight loss is 52 pounds, she has been heavy most of her life, she started gaining weight in 2008, her heaviest weight ever was 199 pounds, she craves sugary snacks, cakes, candy, and donuts, she snacks frequently in the evenings, she wakes up frequently in the middle of the night to eat, she frequently makes poor food choices, she frequently eats larger portions than normal and she struggles with emotional eating.  Depression Screen Angelica Young's Food and Mood (modified PHQ-9) score was 10.  Depression screen PHQ 2/9 12/21/2019  Decreased Interest 1  Down, Depressed, Hopeless 1  PHQ - 2 Score 2  Altered sleeping 1  Tired, decreased energy 3  Change in appetite 2  Feeling bad or failure about yourself  1  Trouble concentrating 1  Moving slowly or  fidgety/restless 0  Suicidal thoughts 0  PHQ-9 Score 10  Difficult doing work/chores Somewhat difficult   Subjective:   1. Other fatigue Angelica Young denies daytime somnolence and admits to waking up still tired. Patent has a history of symptoms of morning fatigue, morning headache and snoring. Angelica Young generally gets 6 or 7 hours of sleep per night, and states that she has poor sleep quality. Snoring is present. Apneic episodes are not present. Epworth Sleepiness Score is 3.  2. SOB (shortness of breath) on exertion Angelica Young notes increasing shortness of breath with exercising and seems to be worsening over time with weight gain. She notes getting out of breath sooner with activity than she used to. This has gotten worse recently. Angelica Young denies shortness of breath at rest or orthopnea.  3. Prediabetes Angelica Young has a diagnosis of prediabetes based on her elevated HgA1c and was informed this puts her at greater risk of developing diabetes. She continues to work on diet and exercise to decrease her risk of diabetes. She denies nausea or hypoglycemia.  She was diagnosed in June 2020, and has had no previous treatment.  4. Essential hypertension Review: taking medications as instructed, no medication side effects noted, no chest pain on exertion, no dyspnea on exertion, no swelling of ankles.  She takes chlorthalidone and verapamil. She is followed virtually by hypertension specialist Dr. Janalyn Shy in [place], [place].   BP Readings from Last 3 Encounters:  12/21/19 115/80  11/28/19 122/82  11/03/19 110/80   5. Other migraine without status  migrainosus, not intractable [name]  She tried Topamax in the past and says, "it made me feel crazy."  She has been prescribed baclofen 10 mg as needed and zonisamide 25 mg daily with a goal of 100 mg daily, but has not started either yet.  6. Endometriosis Angelica Young has a history of infertility and has miscarried twice.  She is  followed by GYN.  7. Vitamin D deficiency She is currently taking vit D 5000 IU daily. She denies nausea, vomiting or muscle weakness.  8. OSA (obstructive sleep apnea) Angelica Young was diagnosed in 2015 and used a CPAP for 1.5 years.  She stopped using it when she got married.  She has morning headache and snoring.  Epworth score is 3.  9. Other depression, with emotional eating Angelica Young is struggling with emotional eating and using food for comfort to the extent that it is negatively impacting her health. She has been working on behavior modification techniques to help reduce her emotional eating and has been unsuccessful. She shows no sign of suicidal or homicidal ideations.  She is taking Prozac.  She emotionally eats when stressed, sad, bored, as a reward, and for comfort.  PHQ-9 is 10 today.  Assessment/Plan:   1. Other fatigue Angelica Young does feel that her weight is causing her energy to be lower than it should be. Fatigue may be related to obesity, depression or many other causes. Labs will be ordered, and in the meanwhile, [name] will focus on self care including making healthy food choices, increasing physical activity and focusing on stress reduction.  EKG was completed at [facility].  Will request.  2. SOB (shortness of breath) on exertion Angelica Young does feel that she gets out of breath more easily that she used to when she exercises. Angelica Young's shortness of breath appears to be obesity related and exercise induced. She has agreed to work on weight loss and gradually increase exercise to treat her exercise induced shortness of breath. Will continue to monitor closely.  3. Prediabetes Angelica Young will continue to work on weight loss, exercise, and decreasing simple carbohydrates to help decrease the risk of diabetes.  Consider metformin.  4. Essential hypertension Angelica Young is working on healthy weight loss and exercise to improve blood pressure control. We will watch for signs of  hypotension as she continues her lifestyle modifications.  5. Other migraine without status migrainosus, not intractable Will refer to Dr. Lucia Gaskins at [facility], as below.  Orders - Ambulatory referral to Neurology  6. Endometriosis Will monitor.  7. Vitamin D deficiency Low Vitamin D level contributes to fatigue and are associated with obesity, breast, and colon cancer. She agrees to continue to take Vitamin D @5 ,000 IU daily and will follow-up for routine testing of Vitamin D, at least 2-3 times per year to avoid over-replacement.  8. OSA (obstructive sleep apnea) Intensive lifestyle modifications are the first line treatment for this issue. We discussed several lifestyle modifications today and she will continue to work on diet, exercise and weight loss efforts. We will continue to monitor. Orders and follow up as documented in patient record.   Counseling  Sleep apnea is a condition in which breathing pauses or becomes shallow during sleep. This happens over and over during the night. This disrupts your sleep and keeps your body from getting the rest that it needs, which can cause tiredness and lack of energy (fatigue) during the day.  Sleep apnea treatment: If you were given a device  to open your airway while you sleep, USE IT!  Sleep hygiene:   Limit or avoid alcohol, caffeinated beverages, and cigarettes, especially close to bedtime.   Do not eat a large meal or eat spicy foods right before bedtime. This can lead to digestive discomfort that can make it hard for you to sleep.  Keep a sleep diary to help you and your health care provider figure out what could be causing your insomnia.  . Make your bedroom a dark, comfortable place where it is easy to fall asleep. ? Put up shades or blackout curtains to block light from outside. ? Use a white noise machine to block noise. ? Keep the temperature cool. . Limit screen use before bedtime. This includes: ? Watching  TV. ? Using your smartphone, tablet, or computer. . Stick to a routine that includes going to bed and waking up at the same times every day and night. This can help you fall asleep faster. Consider making a quiet activity, such as reading, part of your nighttime routine. . Try to avoid taking naps during the day so that you sleep better at night. . Get out of bed if you are still awake after 15 minutes of trying to sleep. Keep the lights down, but try reading or doing a quiet activity. When you feel sleepy, go back to bed.  9. Other depression, with emotional eating Behavior modification techniques were discussed today to help Leimomi deal with her emotional/non-hunger eating behaviors.  Orders and follow up as documented in patient record.   10. At risk for diabetes mellitus Promiss was given approximately 15 minutes of diabetes education and counseling today. We discussed intensive lifestyle modifications today with an emphasis on weight loss as well as increasing exercise and decreasing simple carbohydrates in her diet. We also reviewed medication options with an emphasis on risk versus benefit of those discussed.   11. Class 1 obesity with serious comorbidity and body mass index (BMI) of 33.0 to 33.9 in adult, unspecified obesity type Saria is currently in the action stage of change and her goal is to continue with weight loss efforts. I recommend Kinesha begin the structured treatment plan as follows:  She has agreed to the Category 2 Plan.  Exercise goals: No exercise has been prescribed at this time.   Behavioral modification strategies: increasing lean protein intake, decreasing simple carbohydrates, increasing vegetables and increasing water intake.  She was informed of the importance of frequent follow-up visits to maximize her success with intensive lifestyle modifications for her multiple health conditions. She was informed we would discuss her lab results at her next visit unless  there is a critical issue that needs to be addressed sooner. Malika agreed to keep her next visit at the agreed upon time to discuss these results.  Objective:   Blood pressure 115/80, pulse 77, temperature 98 F (36.7 C), temperature source Oral, height 5\' 2"  (1.575 m), weight 182 lb (82.6 kg), last menstrual period 11/26/2019, SpO2 99 %. Body mass index is 33.29 kg/m.  Indirect Calorimeter completed today shows a VO2 of 250 and a REE of 1736.  Her calculated basal metabolic rate is 11/28/2019 thus her basal metabolic rate is better than expected.  General: Cooperative, alert, well developed, in no acute distress. HEENT: Conjunctivae and lids unremarkable. Cardiovascular: Regular rhythm.  Lungs: Normal work of breathing. Neurologic: No focal deficits.   Lab Results  Component Value Date   CREATININE 0.60 10/12/2019   BUN 11 10/12/2019   NA  137 10/12/2019   K 3.6 10/12/2019   CL 101 10/12/2019   CO2 27 10/12/2019   Lab Results  Component Value Date   WBC 7.8 10/12/2019   HGB 12.6 10/12/2019   HCT 38.5 10/12/2019   MCV 95.1 10/12/2019   PLT 360 10/12/2019   Attestation Statements:   This is the patient's first visit at [facility]. The patient's NEW PATIENT PACKET was reviewed at length. Included in the packet: current and past health history, medications, allergies, ROS, gynecologic history (women only), surgical history, family history, social history, weight history, weight loss surgery history (for those that have had weight loss surgery), nutritional evaluation, mood and food questionnaire, PHQ9, Epworth questionnaire, sleep habits questionnaire, patient life and health improvement goals questionnaire. These will all be scanned into the patient's chart under media.   During the visit, I independently reviewed the patient's EKG, bioimpedance scale results, and indirect calorimeter results. I used this information to tailor a meal plan for the patient that will help  her to lose weight and will improve her obesity-related conditions going forward. I performed a medically necessary appropriate examination and/or evaluation. I discussed the assessment and treatment plan with the patient. The patient was provided an opportunity to ask questions and all were answered. The patient agreed with the plan and demonstrated an understanding of the instructions. Labs were ordered at this visit and will be reviewed at the next visit unless more critical results need to be addressed immediately. Clinical information was updated and documented in the EMR.   I, Water quality scientist, CMA, am acting as Location manager for [employer], DO.  I have reviewed the above documentation for accuracy and completeness, and I agree with the above. Briscoe Deutscher, DO

## 2019-12-26 ENCOUNTER — Institutional Professional Consult (permissible substitution): Payer: Managed Care, Other (non HMO) | Admitting: Obstetrics and Gynecology

## 2019-12-27 LAB — CBC WITH DIFFERENTIAL/PLATELET
Basophils Absolute: 0.1 10*3/uL (ref 0.0–0.2)
Basos: 1 %
EOS (ABSOLUTE): 0.1 10*3/uL (ref 0.0–0.4)
Eos: 2 %
Hemoglobin: 12.3 g/dL (ref 11.1–15.9)
Immature Grans (Abs): 0 10*3/uL (ref 0.0–0.1)
Immature Granulocytes: 0 %
Lymphocytes Absolute: 1.9 10*3/uL (ref 0.7–3.1)
Lymphs: 30 %
MCH: 29.8 pg (ref 26.6–33.0)
MCHC: 32.1 g/dL (ref 31.5–35.7)
MCV: 93 fL (ref 79–97)
Monocytes Absolute: 0.6 10*3/uL (ref 0.1–0.9)
Monocytes: 9 %
Neutrophils Absolute: 3.8 10*3/uL (ref 1.4–7.0)
Neutrophils: 58 %
Platelets: 384 10*3/uL (ref 150–450)
RBC: 4.13 x10E6/uL (ref 3.77–5.28)
RDW: 13 % (ref 11.7–15.4)
WBC: 6.5 10*3/uL (ref 3.4–10.8)

## 2019-12-27 LAB — ANEMIA PANEL
Ferritin: 16 ng/mL (ref 15–150)
Folate, Hemolysate: 446 ng/mL
Folate, RBC: 1164 ng/mL (ref >498)
Hematocrit: 38.3 % (ref 34.0–46.6)
Iron Saturation: 11 % — ABNORMAL LOW (ref 15–55)
Iron: 42 ug/dL (ref 27–159)
Retic Ct Pct: 1.3 % (ref 0.6–2.6)
Total Iron Binding Capacity: 390 ug/dL (ref 250–450)
UIBC: 348 ug/dL (ref 131–425)
Vitamin B-12: 1119 pg/mL (ref 232–1245)

## 2019-12-27 LAB — HEMOGLOBIN A1C
Est. average glucose Bld gHb Est-mCnc: 114 mg/dL
Hgb A1c MFr Bld: 5.6 % (ref 4.8–5.6)

## 2019-12-27 LAB — VITAMIN D 25 HYDROXY (VIT D DEFICIENCY, FRACTURES): Vit D, 25-Hydroxy: 48.5 ng/mL (ref 30.0–100.0)

## 2019-12-27 LAB — INSULIN, RANDOM: INSULIN: 13.4 u[IU]/mL (ref 2.6–24.9)

## 2019-12-28 ENCOUNTER — Encounter (INDEPENDENT_AMBULATORY_CARE_PROVIDER_SITE_OTHER): Payer: Self-pay | Admitting: Family Medicine

## 2019-12-28 ENCOUNTER — Other Ambulatory Visit: Payer: Self-pay

## 2019-12-28 ENCOUNTER — Ambulatory Visit (INDEPENDENT_AMBULATORY_CARE_PROVIDER_SITE_OTHER): Payer: Managed Care, Other (non HMO) | Admitting: Family Medicine

## 2019-12-28 VITALS — BP 123/87 | HR 70 | Temp 98.9°F | Ht 62.0 in | Wt 183.0 lb

## 2019-12-28 DIAGNOSIS — Z9189 Other specified personal risk factors, not elsewhere classified: Secondary | ICD-10-CM

## 2019-12-28 DIAGNOSIS — F3289 Other specified depressive episodes: Secondary | ICD-10-CM | POA: Diagnosis not present

## 2019-12-28 DIAGNOSIS — E559 Vitamin D deficiency, unspecified: Secondary | ICD-10-CM | POA: Diagnosis not present

## 2019-12-28 DIAGNOSIS — Z6833 Body mass index (BMI) 33.0-33.9, adult: Secondary | ICD-10-CM

## 2019-12-28 DIAGNOSIS — E669 Obesity, unspecified: Secondary | ICD-10-CM

## 2019-12-28 DIAGNOSIS — R7303 Prediabetes: Secondary | ICD-10-CM

## 2019-12-28 MED ORDER — BUPROPION HCL ER (SR) 150 MG PO TB12: 150.0000 mg | ORAL_TABLET | Freq: Every day | ORAL | 0 refills | Status: DC

## 2019-12-28 MED ORDER — METFORMIN HCL 500 MG PO TABS: 500.0000 mg | ORAL_TABLET | Freq: Every day | ORAL | 0 refills | Status: DC

## 2020-01-01 NOTE — Progress Notes (Signed)
Chief Complaint:   OBESITY Angelica Young is here to discuss her progress with her obesity treatment plan along with follow-up of her obesity related diagnoses. Angelica Young is on the Category 2 Plan and states she is following her eating plan approximately 75% of the time. Angelica Young states she is walking for 30-40 minutes 3 times per week.  Today's visit was #: 2 Starting weight: 182 lbs Starting date: 12/21/2019 Today's weight: 183 lbs Today's date: 12/28/2019 Total lbs lost to date: 0 Total lbs lost since last in-office visit: 0  Interim History:   Angelica Young provided the following food recall today: Breakfast:  Protein oatmeal, handful of walnuts. Lunch:  2 tacos, [place] (increased shrimp, vegetables) Dinner:  Portion control. Snack:  [place] yogurt, or banana, peanut butter crackers.   Drink:  Coffee with collagen, almond milk  Subjective:   1. Prediabetes Angelica Young has a diagnosis of prediabetes based on her elevated HgA1c and was informed this puts her at greater risk of developing diabetes. She continues to work on diet and exercise to decrease her risk of diabetes. She denies nausea or hypoglycemia.  Lab Results  Component Value Date   HGBA1C 5.6 12/26/2019   Lab Results  Component Value Date   INSULIN 13.4 12/26/2019   2. Other depression Angelica Young is struggling with emotional eating and using food for comfort to the extent that it is negatively impacting her health. She has been working on behavior modification techniques to help reduce her emotional eating and has been unsuccessful. She shows no sign of suicidal or homicidal ideations.  3. Vitamin D deficiency Angelica Young's Vitamin D level was 48.5 on 12/26/2019. She is currently taking vit D. She denies nausea, vomiting or muscle weakness.   Assessment/Plan:   1. Prediabetes Angelica Young will continue to work on weight loss, exercise, and decreasing simple carbohydrates to help decrease the risk of diabetes.   Orders - metFORMIN  (GLUCOPHAGE) 500 MG tablet; Take 1 tablet (500 mg total) by mouth daily with breakfast.  Dispense: 30 tablet; Refill: 0  2. Other depression Behavior modification techniques were discussed today to help Angelica Young deal with her emotional/non-hunger eating behaviors.  Orders and follow up as documented in patient record.   Orders - buPROPion (WELLBUTRIN SR) 150 MG 12 hr tablet; Take 1 tablet (150 mg total) by mouth daily.  Dispense: 30 tablet; Refill: 0  3. Vitamin D deficiency Low Vitamin D level contributes to fatigue and are associated with obesity, breast, and colon cancer. She agrees to continue to continue to take OCT vitamin D daily and will follow-up for routine testing of Vitamin D, at least 2-3 times per year to avoid over-replacement.  4. Class 1 obesity with serious comorbidity and body mass index (BMI) of 33.0 to 33.9 in adult, unspecified obesity type Angelica Young is currently in the action stage of change. As such, her goal is to continue with weight loss efforts. She has agreed to the Category 2 Plan.   Exercise goals: As is.  Behavioral modification strategies: increasing lean protein intake and increasing water intake.  Jennings has agreed to follow-up with our clinic in 2 weeks. She was informed of the importance of frequent follow-up visits to maximize her success with intensive lifestyle modifications for her multiple health conditions.   Objective:   Blood pressure 123/87, pulse 70, temperature 98.9 F (37.2 C), temperature source Oral, height 5\' 2"  (1.575 m), weight 183 lb (83 kg), last menstrual period 12/25/2019, SpO2 98 %. Body mass index is 33.47 kg/m.  General: Cooperative, alert, well developed, in no acute distress. HEENT: Conjunctivae and lids unremarkable. Cardiovascular: Regular rhythm.  Lungs: Normal work of breathing. Neurologic: No focal deficits.   Lab Results  Component Value Date   CREATININE 0.6 11/29/2019   BUN 9 11/29/2019   NA 137 10/12/2019   K  4.0 11/29/2019   CL 101 11/29/2019   CO2 31 (A) 11/29/2019   Lab Results  Component Value Date   ALT 26 11/29/2019   AST 34 11/29/2019   ALKPHOS 56 11/29/2019   Lab Results  Component Value Date   HGBA1C 5.6 12/26/2019   Lab Results  Component Value Date   INSULIN 13.4 12/26/2019   No results found for: TSH Lab Results  Component Value Date   CHOL 159 11/29/2019   HDL 53 11/29/2019   LDLCALC 85 11/29/2019   TRIG 108 11/29/2019   Lab Results  Component Value Date   WBC 6.5 12/26/2019   HGB 12.3 12/26/2019   HCT 38.3 12/26/2019   MCV 93 12/26/2019   PLT 384 12/26/2019   Lab Results  Component Value Date   IRON 42 12/26/2019   TIBC 390 12/26/2019   FERRITIN 16 12/26/2019   Attestation Statements:   Reviewed by clinician on day of visit: allergies, medications, problem list, medical history, surgical history, family history, social history, and previous encounter notes.  I, Water quality scientist, CMA, am acting as Location manager for [employer], DO.  I have reviewed the above documentation for accuracy and completeness, and I agree with the above. Briscoe Deutscher, DO

## 2020-01-04 ENCOUNTER — Ambulatory Visit (INDEPENDENT_AMBULATORY_CARE_PROVIDER_SITE_OTHER): Payer: Managed Care, Other (non HMO) | Admitting: Family Medicine

## 2020-01-22 ENCOUNTER — Ambulatory Visit (INDEPENDENT_AMBULATORY_CARE_PROVIDER_SITE_OTHER): Payer: Managed Care, Other (non HMO) | Admitting: Family Medicine

## 2020-02-01 ENCOUNTER — Other Ambulatory Visit (INDEPENDENT_AMBULATORY_CARE_PROVIDER_SITE_OTHER): Payer: Self-pay | Admitting: Family Medicine

## 2020-02-01 DIAGNOSIS — R7303 Prediabetes: Secondary | ICD-10-CM

## 2020-02-12 ENCOUNTER — Telehealth: Payer: Self-pay | Admitting: Neurology

## 2020-02-12 ENCOUNTER — Other Ambulatory Visit: Payer: Self-pay

## 2020-02-12 ENCOUNTER — Ambulatory Visit: Payer: BLUE CROSS/BLUE SHIELD | Admitting: Neurology

## 2020-02-12 ENCOUNTER — Encounter: Payer: Self-pay | Admitting: Neurology

## 2020-02-12 VITALS — BP 124/89 | HR 74 | Temp 97.4°F | Ht 62.0 in | Wt 183.0 lb

## 2020-02-12 DIAGNOSIS — G43709 Chronic migraine without aura, not intractable, without status migrainosus: Secondary | ICD-10-CM | POA: Diagnosis not present

## 2020-02-12 DIAGNOSIS — R51 Headache with orthostatic component, not elsewhere classified: Secondary | ICD-10-CM

## 2020-02-12 DIAGNOSIS — H539 Unspecified visual disturbance: Secondary | ICD-10-CM

## 2020-02-12 DIAGNOSIS — H547 Unspecified visual loss: Secondary | ICD-10-CM

## 2020-02-12 DIAGNOSIS — R519 Headache, unspecified: Secondary | ICD-10-CM | POA: Diagnosis not present

## 2020-02-12 DIAGNOSIS — G43109 Migraine with aura, not intractable, without status migrainosus: Secondary | ICD-10-CM | POA: Diagnosis not present

## 2020-02-12 DIAGNOSIS — H538 Other visual disturbances: Secondary | ICD-10-CM

## 2020-02-12 MED ORDER — NARATRIPTAN HCL 2.5 MG PO TABS: 2.5000 mg | ORAL_TABLET | ORAL | 11 refills | Status: DC | PRN

## 2020-02-12 MED ORDER — METHYLPREDNISOLONE 4 MG PO TBPK: ORAL_TABLET | ORAL | 1 refills | Status: DC

## 2020-02-12 MED ORDER — AJOVY 225 MG/1.5ML ~~LOC~~ SOAJ: 225.0000 mg | SUBCUTANEOUS | 11 refills | Status: DC

## 2020-02-12 NOTE — Progress Notes (Signed)
[id number]    Provider:  Dr Lucia Gaskins Requesting Provider: Helane Rima, DO Primary Care Provider:  Philip Aspen, Limmie Patricia, MD  CC:  Migraines  HPI:  Angelica Young is a 39 y.o. female here as requested by Helane Rima, DO for migraines. Migraines started at the age of 15, mother and maternal grandmother had migraines. Migraines worsening. In college she took excedrin migraine which helps acutely, she tried imitrex, topamax, pamprin, worsening migraines in the setting of stress. Migraines start unilaterally either side, throbbing pain, she does not often have an aura but rarely she sees stars, nausea, photo/phonophobia, she has vomited < 5 times, laying down helps, they can last 24-72 hours untreated, sumatriptan helps but has to take 100mg , no numbness, no tingling, + vision changes, can be worse positionally bending over and moving can severely increase head pain, sometimes lying down helps, stress makes it worse, being in the dark helps, she has blurry vision and spots of losses of vision, no iniciting events such as head trauma or injury, she wakes with migraines woke with one this morning at 6am and can be worse in the morning,  no dizziness. 25 headache days a month, 15 moderately severe to severe migraine days a months for the last 5 years. Worsening, no new syptoms.No other focal neurologic deficits, associated symptoms, inciting events or modifiable factors.  Reviewed notes, labs and imaging from outside physicians, which showed:  hgba1c 5.6 Cbc normal Bmp unremarkable BUN 9 and cr 0.6   Meds tried: imitrex, topamax, prozac, toradol inj, zofran injection, scopolamine patch, zofran injection, baclofen, verapamil, propranolol and metoprolol, amitriptyline.  Review of Systems: Patient complains of symptoms per HPI as well as the following symptoms: headaches. Pertinent negatives and positives per HPI. All others negative.   Social History   Socioeconomic  History  . Marital status: Divorced    Spouse name: Not on file  . Number of children: 0  . Years of education: Not on file  . Highest education level: Master's degree (e.g., MA, MS, MEng, MEd, MSW, MBA)  Occupational History  . Occupation: HR Manager  Tobacco Use  . Smoking status: Never Smoker  . Smokeless tobacco: Never Used  Substance and Sexual Activity  . Alcohol use: Yes    Comment: occas.  . Drug use: Never  . Sexual activity: Not Currently    Birth control/protection: None    Comment: 1st intercourse 39yo-Fewer than 5 partners  Other Topics Concern  . Not on file  Social History Narrative   Lives at home alone   Right handed   Caffeine: 1 cup/day   Social Determinants of Health   Financial Resource Strain:   . Difficulty of Paying Living Expenses:   Food Insecurity:   . Worried About in the Last Year:   . Programme researcher, broadcasting/film/video in the Last Year:   Transportation Needs:   . Barista (Medical):   Freight forwarder Lack of Transportation (Non-Medical):   Physical Activity:   . Days of Exercise per Week:   . Minutes of Exercise per Session:   Stress:   . Feeling of Stress :   Social Connections:   . Frequency of Communication with Friends and Family:   . Frequency of Social Gatherings with Friends and Family:   . Attends Religious Services:   . Active Member of Clubs or Organizations:   . Attends [employer] Meetings:   Banker Marital Status:   Intimate Partner Violence:   .  Fear of Current or Ex-Partner:   . Emotionally Abused:   [employer] [employer] Physically Abused:   . Sexually Abused:     Family History  Problem Relation Age of Onset  . Migraines Mother   . CAD Father   . Hypertension Father   . Stroke Father   . Sleep apnea Father   . Migraines Maternal Grandmother   . Migraines Sister     Past Medical History:  Diagnosis Date  . Anxiety   . Depression   . Endometriosis 2014  . Hypertension   . IGT (impaired glucose tolerance)   .  Infertility, female   . Migraines   . Pre-diabetes   . Sleep apnea   . Vitamin D deficiency     Patient Active Problem List   Diagnosis Date Noted  . Chronic migraine without aura without status migrainosus, not intractable 02/12/2020  . Migraine with aura and without status migrainosus, not intractable 02/12/2020  . Hypertension   . Depression   . IGT (impaired glucose tolerance)   . Migraines   . Endometriosis 2014    Past Surgical History:  Procedure Laterality Date  . BREAST SURGERY     reduction 2009  . DILATION AND CURETTAGE OF UTERUS    . HERNIA REPAIR  2011  . LAPAROSCOPIC ASSISTED VENTRAL HERNIA REPAIR N/A 10/16/2019   Procedure: LAPAROSCOPIC ASSISTED VENTRAL HERNIA REPAIR WITH MESH;  Surgeon: Johnathan Hausen, MD;  Location: [facility];  Service: General;  Laterality: N/A;  . NM Mary Esther  [id number]   miscarried   . PELVIC LAPAROSCOPY     x 4 for endometriosis    Current Outpatient Medications  Medication Sig Dispense Refill  . chlorthalidone (HYGROTON) 25 MG tablet Take 25 mg by mouth daily.    . Cyanocobalamin (VITAMIN B 12) 500 MCG TABS Take 1 tablet by mouth daily.    Marland Kitchen FLUoxetine (PROZAC) 40 MG capsule Take 40 mg by mouth daily.    . Misc Natural Products (JOINT SUPPORT COMPLEX PO) Take 1 capsule by mouth daily.    . Multiple Vitamin (MULTIVITAMIN PO) Take 30 mLs by mouth daily. Source of life    . omega-3 acid ethyl esters (LOVAZA) 1 g capsule Take 1 g by mouth daily.    . verapamil (CALAN-SR) 180 MG CR tablet Take 180 mg by mouth at bedtime.    Marland Kitchen VITAMIN D PO Take 5,000 Units by mouth daily.     . Fremanezumab-vfrm (AJOVY) 225 MG/1.5ML SOAJ Inject 225 mg into the skin every 30 (thirty) days. 1 pen 11  . metFORMIN (GLUCOPHAGE) 500 MG tablet Take 1 tablet (500 mg total) by mouth daily with breakfast. (Patient not taking: Reported on 02/12/2020) 30 tablet 0  . methylPREDNISolone (MEDROL DOSEPAK) 4 MG TBPK tablet Take pills in the morning with food x 6 days. 21  tablet 1  . naratriptan (AMERGE) 2.5 MG tablet Take 1 tablet (2.5 mg total) by mouth as needed for migraine. Take one (1) tablet at onset of headache; if returns or does not resolve, may repeat after 2 hours; do not exceed five (5) mg in 24 hours. 10 tablet 11   No current facility-administered medications for this visit.    Allergies as of 02/12/2020 - Review Complete 02/12/2020  Allergen Reaction Noted  . Vicodin [hydrocodone-acetaminophen] Nausea And Vomiting 10/11/2019  . Sulfa antibiotics Rash 10/11/2019    Vitals: BP 124/89 (BP Location: Right Arm, Patient Position: Sitting)   Pulse 74   Temp (!) 97.4 F (36.3  C) Comment: taken at front  Ht 5\' 2"  (1.575 m)   Wt 183 lb (83 kg)   LMP 02/11/2020 (Approximate)   BMI 33.47 kg/m  Last Weight:  Wt Readings from Last 1 Encounters:  02/12/20 183 lb (83 kg)   Last Height:   Ht Readings from Last 1 Encounters:  02/12/20 5\' 2"  (1.575 m)     Physical exam: Exam: Gen: NAD, conversant, well nourised, obese, well groomed                     CV: RRR, no MRG. No Carotid Bruits. No peripheral edema, warm, nontender Eyes: Conjunctivae clear without exudates or hemorrhage  Neuro: Detailed Neurologic Exam  Speech:    Speech is normal; fluent and spontaneous with normal comprehension.  Cognition:    The patient is oriented to person, place, and time;     recent and remote memory intact;     language fluent;     normal attention, concentration,     fund of knowledge Cranial Nerves:    The pupils are equal, round, and reactive to light. The fundi are normal and spontaneous venous pulsations are present. Visual fields are full to finger confrontation. Extraocular movements are intact. Trigeminal sensation is intact and the muscles of mastication are normal. The face is symmetric. The palate elevates in the midline. Hearing intact. Voice is normal. Shoulder shrug is normal. The tongue has normal motion without fasciculations.    Coordination:    No dysmetria or ataxia  Gait:    Normal native gait  Motor Observation:    No asymmetry, no atrophy, and no involuntary movements noted. Tone:    Normal muscle tone.    Posture:    Posture is normal. normal erect    Strength:    Strength is V/V in the upper and lower limbs.      Sensation: intact to LT     Reflex Exam:  DTR's:    Deep tendon reflexes in the upper and lower extremities are normal bilaterally.   Toes:    The toes are equivbilaterally.   Clonus:    Clonus is absent.    Assessment/Plan:  [age] year old with chronic migraines. However given concerning symptoms she need a thorough evaluation with MRI brain.  Preventative: Start Ajovy today.  Acute: Amerge(naratriptan): Please take one tablet at the onset of your headache. If it does not improve the symptoms please take one additional tablet. Do not take more then 2 tablets in 24hrs. Do not take use more then 2 to 3 times in a week. 6 days: medrol dosepak Let your obgyn know that you have migraine with aura for birth control conversation, discussed continuous birth control as well  Discussed: There is increased risk for stroke in women with migraine with aura and a contraindication for the combined contraceptive pill for use by women who have migraine with aura. The risk for women with migraine without aura is lower. However other risk factors like smoking are far more likely to increase stroke risk than migraine. There is a recommendation for no smoking and for the use of OCPs without estrogen such as progestogen only pills particularly for women with migraine with aura.Lovely People who have migraine headaches with auras may be 3 times more likely to have a stroke caused by a blood clot, compared to migraine patients who don't see auras. Women who take hormone-replacement therapy may be 30 percent more likely to suffer a clot-based stroke  than women not taking medication containing estrogen. Other  risk factors like smoking and high blood pressure may be  much more important.  To prevent or relieve headaches, try the following: Cool Compress. Lie down and place a cool compress on your head.  Avoid headache triggers. If certain foods or odors seem to have triggered your migraines in the past, avoid them. A headache diary might help you identify triggers.  Include physical activity in your daily routine. Try a daily walk or other moderate aerobic exercise.  Manage stress. Find healthy ways to cope with the stressors, such as delegating tasks on your to-do list.  Practice relaxation techniques. Try deep breathing, yoga, massage and visualization.  Eat regularly. Eating regularly scheduled meals and maintaining a healthy diet might help prevent headaches. Also, drink plenty of fluids.  Follow a regular sleep schedule. Sleep deprivation might contribute to headaches Consider biofeedback. With this mind-body technique, you learn to control certain bodily functions -- such as muscle tension, heart rate and blood pressure -- to prevent headaches or reduce headache pain.    Proceed to emergency room if you experience new or worsening symptoms or symptoms do not resolve, if you have new neurologic symptoms or if headache is severe, or for any concerning symptom.   Provided education and documentation from American headache Society toolbox including articles on: chronic migraine medication overuse headache, chronic migraines, prevention of migraines, behavioral and other nonpharmacologic treatments for headache.  Orders Placed This Encounter  Procedures  . MR BRAIN W WO CONTRAST   Meds ordered this encounter  Medications  . naratriptan (AMERGE) 2.5 MG tablet    Sig: Take 1 tablet (2.5 mg total) by mouth as needed for migraine. Take one (1) tablet at onset of headache; if returns or does not resolve, may repeat after 2 hours; do not exceed five (5) mg in 24 hours.    Dispense:  10 tablet     Refill:  11  . methylPREDNISolone (MEDROL DOSEPAK) 4 MG TBPK tablet    Sig: Take pills in the morning with food x 6 days.    Dispense:  21 tablet    Refill:  1  . Fremanezumab-vfrm (AJOVY) 225 MG/1.5ML SOAJ    Sig: Inject 225 mg into the skin every 30 (thirty) days.    Dispense:  1 pen    Refill:  11    Patient has copay card; she can have medication for $5 regardless of insurance approval or copay amount.    Cc: Helane Rima, DO,  Philip Aspen, Limmie Patricia, MD  Naomie Dean, MD  [facility] [address] [place], [place] [address]  Phone [phone] Fax [phone]  I spent 60 minutes of face-to-face and non-face-to-face time with patient on the  1. Chronic migraine without aura without status migrainosus, not intractable   2. Migraine with aura and without status migrainosus, not intractable   3. Positional headache   4. Morning headache   5. Vision changes   6. Blurry vision   7. Vision loss    diagnosis.  This included previsit chart review, lab review, study review, order entry, electronic health record documentation, patient education on the different diagnostic and therapeutic options, counseling and coordination of care, risks and benefits of management, compliance, or risk factor reduction

## 2020-02-12 NOTE — Patient Instructions (Addendum)
- Preventative: Start Ajovy today.  - Acute: Amerge(naratriptan): Please take one tablet at the onset of your headache. If it does not improve the symptoms please take one additional tablet. Do not take more then 2 tablets in 24hrs. Do not take use more then 2 to 3 times in a week. - 6 days: medrol dosepak to help give you some relief in the meantime - MRI of the brain  - follow up 3-4 months   There is increased risk for stroke in women with migraine with aura and a contraindication for the combined contraceptive pill for use by women who have migraine with aura. The risk for women with migraine without aura is lower. However other risk factors like smoking are far more likely to increase stroke risk than migraine. There is a recommendation for no smoking and for the use of OCPs without estrogen such as progestogen only pills particularly for women with migraine with aura.Marland Kitchen People who have migraine headaches with auras may be 3 times more likely to have a stroke caused by a blood clot, compared to migraine patients who don't see auras. Women who take hormone-replacement therapy may be 30 percent more likely to suffer a clot-based stroke than women not taking medication containing estrogen. Other risk factors like smoking and high blood pressure may be  much more important.  Methylprednisolone tablets What is this medicine? METHYLPREDNISOLONE (meth ill pred NISS oh lone) is a corticosteroid. It is commonly used to treat inflammation of the skin, joints, lungs, and other organs. Common conditions treated include asthma, allergies, and arthritis. It is also used for other conditions, such as blood disorders and diseases of the adrenal glands. This medicine may be used for other purposes; ask your health care provider or pharmacist if you have questions. COMMON BRAND NAME(S): Medrol, Medrol Dosepak What should I tell my health care provider before I take this medicine? They need to know if you have any  of these conditions:  Cushing's syndrome  eye disease, vision problems  diabetes  glaucoma  heart disease  high blood pressure  infection (especially a virus infection such as chickenpox, cold sores, or herpes)  liver disease  mental illness  myasthenia gravis  osteoporosis  recently received or scheduled to receive a vaccine  seizures  stomach or intestine problems  thyroid disease  an unusual or allergic reaction to lactose, methylprednisolone, other medicines, foods, dyes, or preservatives  pregnant or trying to get pregnant  breast-feeding How should I use this medicine? Take this medicine by mouth with a glass of water. Follow the directions on the prescription label. Take this medicine with food. If you are taking this medicine once a day, take it in the morning. Do not take it more often than directed. Do not suddenly stop taking your medicine because you may develop a severe reaction. Your doctor will tell you how much medicine to take. If your doctor wants you to stop the medicine, the dose may be slowly lowered over time to avoid any side effects. Talk to your pediatrician regarding the use of this medicine in children. Special care may be needed. Overdosage: If you think you have taken too much of this medicine contact a poison control center or emergency room at once. NOTE: This medicine is only for you. Do not share this medicine with others. What if I miss a dose? If you miss a dose, take it as soon as you can. If it is almost time for your next dose, talk  to your doctor or health care professional. Bonita QuinYou may need to miss a dose or take an extra dose. Do not take double or extra doses without advice. What may interact with this medicine? Do not take this medicine with any of the following medications:  alefacept  echinacea  live virus vaccines  metyrapone  mifepristone This medicine may also interact with the following medications:  amphotericin  B  aspirin and aspirin-like medicines  certain antibiotics like erythromycin, clarithromycin, troleandomycin  certain medicines for diabetes  certain medicines for fungal infections like ketoconazole  certain medicines for seizures like carbamazepine, phenobarbital, phenytoin  certain medicines that treat or prevent blood clots like warfarin  cholestyramine  cyclosporine  digoxin  diuretics  female hormones, like estrogens and birth control pills  isoniazid  NSAIDs, medicines for pain inflammation, like ibuprofen or naproxen  other medicines for myasthenia gravis  rifampin  vaccines This list may not describe all possible interactions. Give your health care provider a list of all the medicines, herbs, non-prescription drugs, or dietary supplements you use. Also tell them if you smoke, drink alcohol, or use illegal drugs. Some items may interact with your medicine. What should I watch for while using this medicine? Tell your doctor or healthcare professional if your symptoms do not start to get better or if they get worse. Do not stop taking except on your doctor's advice. You may develop a severe reaction. Your doctor will tell you how much medicine to take. This medicine may increase your risk of getting an infection. Tell your doctor or health care professional if you are around anyone with measles or chickenpox, or if you develop sores or blisters that do not heal properly. This medicine may increase blood sugar levels. Ask your healthcare provider if changes in diet or medicines are needed if you have diabetes. Tell your doctor or health care professional right away if you have any change in your eyesight. Using this medicine for a long time may increase your risk of low bone mass. Talk to your doctor about bone health. What side effects may I notice from receiving this medicine? Side effects that you should report to your doctor or health care professional as soon as  possible:  allergic reactions like skin rash, itching or hives, swelling of the face, lips, or tongue  bloody or tarry stools  hallucination, loss of contact with reality  muscle cramps  muscle pain  palpitations  signs and symptoms of high blood sugar such as being more thirsty or hungry or having to urinate more than normal. You may also feel very tired or have blurry vision.  signs and symptoms of infection like fever or chills; cough; sore throat; pain or trouble passing urine Side effects that usually do not require medical attention (report to your doctor or health care professional if they continue or are bothersome):  changes in emotions or mood  constipation  diarrhea  excessive hair growth on the face or body  headache  nausea, vomiting  trouble sleeping  weight gain This list may not describe all possible side effects. Call your doctor for medical advice about side effects. You may report side effects to FDA at 1-800-FDA-1088. Where should I keep my medicine? Keep out of the reach of children. Store at room temperature between 20 and 25 degrees C (68 and 77 degrees F). Throw away any unused medicine after the expiration date. NOTE: This sheet is a summary. It may not cover all possible information. If you  have questions about this medicine, talk to your doctor, pharmacist, or health care provider.  2020 Elsevier/Gold Standard (2018-06-30 09:19:36) Naratriptan tablets What is this medicine? NARATRIPTAN (NAR a trip tan) is used to treat migraines with or without aura. An aura is a strange feeling or visual disturbance that warns you of an attack. It is not used to prevent migraines. This medicine may be used for other purposes; ask your health care provider or pharmacist if you have questions. COMMON BRAND NAME(S): Amerge What should I tell my health care provider before I take this medicine? They need to know if you have any of these conditions:  cigarette  smoker  circulation problems in fingers and toes  diabetes  heart disease  high blood pressure  high cholesterol  history of irregular heartbeat  history of stroke  kidney disease  liver disease  stomach or intestine problems  an unusual or allergic reaction to naratriptan, other medicines, foods, dyes, or preservatives  pregnant or trying to get pregnant  breast-feeding How should I use this medicine? Take this medicine by mouth with a glass of water. Follow the directions on the prescription label. Do not take it more often than directed. Talk to your pediatrician regarding the use of this medicine in children. Special care may be needed. Overdosage: If you think you have taken too much of this medicine contact a poison control center or emergency room at once. NOTE: This medicine is only for you. Do not share this medicine with others. What if I miss a dose? This does not apply. This medicine is not for regular use. What may interact with this medicine? Do not take this medicine with any of the following medicines:  ergot alkaloids like dihydroergotamine, ergonovine, ergotamine, methylergonovine  certain medicines for migraine headache like almotriptan, eletriptan, frovatriptan, naratriptan, rizatriptan, sumatriptan, zolmitriptan This medicine may also interact with the following medications:  certain medicines for depression, anxiety, or psychotic disorders This list may not describe all possible interactions. Give your health care provider a list of all the medicines, herbs, non-prescription drugs, or dietary supplements you use. Also tell them if you smoke, drink alcohol, or use illegal drugs. Some items may interact with your medicine. What should I watch for while using this medicine? Visit your healthcare professional for regular checks on your progress. Tell your healthcare professional if your symptoms do not start to get better or if they get worse. You may  get drowsy or dizzy. Do not drive, use machinery, or do anything that needs mental alertness until you know how this medicine affects you. Do not stand up or sit up quickly, especially if you are an older patient. This reduces the risk of dizzy or fainting spells. Alcohol may interfere with the effect of this medicine. Tell your healthcare professional right away if you have any change in your eyesight. If you take migraine medicines for 10 or more days a month, your migraines may get worse. Keep a diary of headache days and medicine use. Contact your healthcare professional if your migraine attacks occur more frequently. What side effects may I notice from receiving this medicine? Side effects that you should report to your doctor or health care professional as soon as possible:  allergic reactions like skin rash, itching or hives, swelling of the face, lips, or tongue  changes in vision  chest pain or chest tightness  signs and symptoms of a dangerous change in heartbeat or heart rhythm like chest pain; dizziness; fast, irregular heartbeat;  palpitations; feeling faint or lightheaded; falls; breathing problems  signs and symptoms of a stroke like changes in vision; confusion; trouble speaking or understanding; severe headaches; sudden numbness or weakness of the face, arm or leg; trouble walking; dizziness; loss of balance or coordination  signs and symptoms of serotonin syndrome like irritable; confusion; diarrhea; fast or irregular heartbeat; muscle twitching; stiff muscles; trouble walking; sweating; high fever; seizures; chills; vomiting Side effects that usually do not require medical attention (report to your doctor or health care professional if they continue or are bothersome):  diarrhea  dizziness  drowsiness  headache  nausea, vomiting  pain, tingling, numbness in the hands or feet  stomach pain This list may not describe all possible side effects. Call your doctor for  medical advice about side effects. You may report side effects to FDA at 1-800-FDA-1088. Where should I keep my medicine? Keep out of the reach of children. Store at room temperature between 20 and 25 degrees C (68 and 77 degrees F). Throw away any unused medicine after the expiration date. NOTE: This sheet is a summary. It may not cover all possible information. If you have questions about this medicine, talk to your doctor, pharmacist, or health care provider.  2020 Elsevier/Gold Standard (2018-04-12 14:55:22) Rolanda Lundborg injection What is this medicine? FREMANEZUMAB (fre ma NEZ ue mab) is used to prevent migraine headaches. This medicine may be used for other purposes; ask your health care provider or pharmacist if you have questions. COMMON BRAND NAME(S): AJOVY What should I tell my health care provider before I take this medicine? They need to know if you have any of these conditions:  an unusual or allergic reaction to fremanezumab, other medicines, foods, dyes, or preservatives  pregnant or trying to get pregnant  breast-feeding How should I use this medicine? This medicine is for injection under the skin. You will be taught how to prepare and give this medicine. Use exactly as directed. Take your medicine at regular intervals. Do not take your medicine more often than directed. It is important that you put your used needles and syringes in a special sharps container. Do not put them in a trash can. If you do not have a sharps container, call your pharmacist or healthcare provider to get one. Talk to your pediatrician regarding the use of this medicine in children. Special care may be needed. Overdosage: If you think you have taken too much of this medicine contact a poison control center or emergency room at once. NOTE: This medicine is only for you. Do not share this medicine with others. What if I miss a dose? If you miss a dose, take it as soon as you can. If it is almost time  for your next dose, take only that dose. Do not take double or extra doses. What may interact with this medicine? Interactions are not expected. This list may not describe all possible interactions. Give your health care provider a list of all the medicines, herbs, non-prescription drugs, or dietary supplements you use. Also tell them if you smoke, drink alcohol, or use illegal drugs. Some items may interact with your medicine. What should I watch for while using this medicine? Tell your doctor or healthcare professional if your symptoms do not start to get better or if they get worse. What side effects may I notice from receiving this medicine? Side effects that you should report to your doctor or health care professional as soon as possible:  allergic reactions like skin rash,  itching or hives, swelling of the face, lips, or tongue Side effects that usually do not require medical attention (report these to your doctor or health care professional if they continue or are bothersome):  pain, redness, or irritation at site where injected This list may not describe all possible side effects. Call your doctor for medical advice about side effects. You may report side effects to FDA at 1-800-FDA-1088. Where should I keep my medicine? Keep out of the reach of children. You will be instructed on how to store this medicine. Throw away any unused medicine after the expiration date on the label. NOTE: This sheet is a summary. It may not cover all possible information. If you have questions about this medicine, talk to your doctor, pharmacist, or health care provider.  2020 Elsevier/Gold Standard (2017-06-28 17:22:56)

## 2020-02-12 NOTE — Telephone Encounter (Signed)
Pt is asking for a call from RN to discuss switching from Ajovy to [employer]

## 2020-02-13 ENCOUNTER — Ambulatory Visit (INDEPENDENT_AMBULATORY_CARE_PROVIDER_SITE_OTHER): Payer: BLUE CROSS/BLUE SHIELD | Admitting: Family Medicine

## 2020-02-13 ENCOUNTER — Other Ambulatory Visit: Payer: Self-pay

## 2020-02-13 ENCOUNTER — Encounter (INDEPENDENT_AMBULATORY_CARE_PROVIDER_SITE_OTHER): Payer: Self-pay | Admitting: Family Medicine

## 2020-02-13 VITALS — HR 67 | Temp 98.0°F | Ht 62.0 in | Wt 179.0 lb

## 2020-02-13 DIAGNOSIS — Z9189 Other specified personal risk factors, not elsewhere classified: Secondary | ICD-10-CM | POA: Diagnosis not present

## 2020-02-13 DIAGNOSIS — G43109 Migraine with aura, not intractable, without status migrainosus: Secondary | ICD-10-CM | POA: Diagnosis not present

## 2020-02-13 DIAGNOSIS — E559 Vitamin D deficiency, unspecified: Secondary | ICD-10-CM | POA: Diagnosis not present

## 2020-02-13 DIAGNOSIS — F3289 Other specified depressive episodes: Secondary | ICD-10-CM

## 2020-02-13 DIAGNOSIS — R7303 Prediabetes: Secondary | ICD-10-CM | POA: Diagnosis not present

## 2020-02-13 DIAGNOSIS — R632 Polyphagia: Secondary | ICD-10-CM | POA: Diagnosis not present

## 2020-02-13 DIAGNOSIS — G4700 Insomnia, unspecified: Secondary | ICD-10-CM

## 2020-02-13 DIAGNOSIS — Z6832 Body mass index (BMI) 32.0-32.9, adult: Secondary | ICD-10-CM

## 2020-02-13 DIAGNOSIS — E669 Obesity, unspecified: Secondary | ICD-10-CM

## 2020-02-13 MED ORDER — METFORMIN HCL 500 MG PO TABS: 500.0000 mg | ORAL_TABLET | Freq: Two times a day (BID) | ORAL | 1 refills | Status: DC

## 2020-02-13 NOTE — Progress Notes (Signed)
Chief Complaint:   OBESITY Angelica Young is here to discuss her progress with her obesity treatment plan along with follow-up of her obesity related diagnoses. Angelica Young is on the Category 2 Plan and states she is following her eating plan approximately 40-50% of the time. Angelica Young states she is walking for 40-45 minutes 2 times per week.  Today's visit was #: 3 Starting weight: 182 lbs Starting date: 12/21/2019 Today's weight: 179 lbs Today's date: 02/13/2020 Total lbs lost to date: 3 lbs Total lbs lost since last in-office visit: 4 lbs  Interim History: Angelica Young says she has been doing increased night snacking or eating due to stress to calm herself or when watching TV.  Angelica Young provided the following food recall today:  Breakfast:  Following plan. Lunch:  Following 50% - Meat, vegetable, using scale. Dinner:  Still "heavier" (meatloaf, potatoes, green beans, squash).  Subjective:   1. Prediabetes Angelica Young has a diagnosis of prediabetes based on her elevated HgA1c and was informed this puts her at greater risk of developing diabetes. She continues to work on diet and exercise to decrease her risk of diabetes. She denies nausea or hypoglycemia.  She is taking metformin 500 mg daily.  Lab Results  Component Value Date   HGBA1C 5.6 12/26/2019   Lab Results  Component Value Date   INSULIN 13.4 12/26/2019   2. Vitamin D deficiency Angelica Young's Vitamin D level was 48.5 on 12/26/2019. She is currently taking prescription vitamin D 50,000 IU each week. She denies nausea, vomiting or muscle weakness.  3. Migraine with aura and without status migrainosus, not intractable Angelica Young was recently evaluated by Neurology for her migraines. Recent visit reviewed together.  4. Polyphagia Angelica Young endorses excessive hunger.  This has worsened over the past week, since Angelica Young started her menses and is taking steroids (to break her current migraine).  5. Insomnia, sleep onset Angelica Young has been using the  meditation app [employer]  and says it is helping with her insomnia.  6. Other depression, with emotional eating Angelica Young is struggling with emotional eating and using food for comfort to the extent that it is negatively impacting her health. She has been working on behavior modification techniques to help reduce her emotional eating and has been unsuccessful. She shows no sign of suicidal or homicidal ideations.  7. At risk for diarrhea Angelica Young is at higher risk of diarrhea due to metformin.  Assessment/Plan:   1. Prediabetes Angelica Young will continue to work on weight loss, exercise, and decreasing simple carbohydrates to help decrease the risk of diabetes.  Will increase metformin to twice daily dosing.  Orders - metFORMIN (GLUCOPHAGE) 500 MG tablet; Take 1 tablet (500 mg total) by mouth 2 (two) times daily.  Dispense: 60 tablet; Refill: 1  2. Vitamin D deficiency Low Vitamin D level contributes to fatigue and are associated with obesity, breast, and colon cancer. She agrees to continue to take prescription Vitamin D @50 ,000 IU every week and will follow-up for routine testing of Vitamin D, at least 2-3 times per year to avoid over-replacement.  3. Migraine with aura and without status migrainosus, not intractable Followed by Neurology for this problem. Those encounter notes were reviewed. Orders and follow up as documented in patient record. This issue directly impacts care plan for optimization of BMI and metabolic health as it impacts the patient's ability to make lifestyle changes.  4. Polyphagia Intensive lifestyle modifications are the first line treatment for this issue. We discussed several lifestyle modifications today and she will  continue to work on diet, exercise and weight loss efforts. Orders and follow up as documented in patient record.  Counseling . Polyphagia is excessive hunger. . Causes can include: low blood sugars, hypERthyroidism, PMS, lack of sleep, stress, insulin  resistance, diabetes, certain medications, and diets that are deficient in protein and fiber.   5. Insomnia The problem of recurrent insomnia was discussed. Orders and follow up as documented in patient record. Counseling: Intensive lifestyle modifications are the first line treatment for this issue. We discussed several lifestyle modifications today and she will continue to work on diet, exercise and weight loss efforts.   Counseling  Limit or avoid alcohol, caffeinated beverages, and cigarettes, especially close to bedtime.   Do not eat a large meal or eat spicy foods right before bedtime. This can lead to digestive discomfort that can make it hard for you to sleep.  Keep a sleep diary to help you and your health care provider figure out what could be causing your insomnia.  . Make your bedroom a dark, comfortable place where it is easy to fall asleep. ? Put up shades or blackout curtains to block light from outside. ? Use a white noise machine to block noise. ? Keep the temperature cool. . Limit screen use before bedtime. This includes: ? Watching TV. ? Using your smartphone, tablet, or computer. . Stick to a routine that includes going to bed and waking up at the same times every day and night. This can help you fall asleep faster. Consider making a quiet activity, such as reading, part of your nighttime routine. . Try to avoid taking naps during the day so that you sleep better at night. . Get out of bed if you are still awake after 15 minutes of trying to sleep. Keep the lights down, but try reading or doing a quiet activity. When you feel sleepy, go back to bed.  6. Other depression, with emotional eating Patient was referred to Dr. Mallie Mussel, our Bariatric Psychologist, for evaluation due to her elevated PHQ-9 score and significant struggles with emotional eating.  7. At risk for diarrhea Angelica Young was given approximately 15 minutes of diarrhea prevention counseling today. She is 39  y.o. female and has risk factors for diarrhea including medications and changes in diet. We discussed intensive lifestyle modifications today with an emphasis on specific weight loss instructions including dietary strategies.   Repetitive spaced learning was employed today to elicit superior memory formation and behavioral change.  8. Class 1 obesity with serious comorbidity and body mass index (BMI) of 32.0 to 32.9 in adult, unspecified obesity type Angelica Young is currently in the action stage of change. As such, her goal is to continue with weight loss efforts. She has agreed to the Category 2 Plan.   Exercise goals: For substantial health benefits, adults should do at least 150 minutes (2 hours and 30 minutes) a week of moderate-intensity, or 75 minutes (1 hour and 15 minutes) a week of vigorous-intensity aerobic physical activity, or an equivalent combination of moderate- and vigorous-intensity aerobic activity. Aerobic activity should be performed in episodes of at least 10 minutes, and preferably, it should be spread throughout the week.  Behavioral modification strategies: increasing lean protein intake, decreasing simple carbohydrates, increasing vegetables, increasing water intake and decreasing liquid calories.  Angelica Young has agreed to follow-up with our clinic in 2 weeks. She was informed of the importance of frequent follow-up visits to maximize her success with intensive lifestyle modifications for her multiple health conditions.  Objective:   Pulse 67, temperature 98 F (36.7 C), temperature source Oral, height 5\' 2"  (1.575 m), weight 179 lb (81.2 kg), last menstrual period 02/11/2020, SpO2 97 %. Body mass index is 32.74 kg/m.  General: Cooperative, alert, well developed, in no acute distress. HEENT: Conjunctivae and lids unremarkable. Cardiovascular: Regular rhythm.  Lungs: Normal work of breathing. Neurologic: No focal deficits.   Lab Results  Component Value Date   CREATININE  0.6 11/29/2019   BUN 9 11/29/2019   NA 137 10/12/2019   K 4.0 11/29/2019   CL 101 11/29/2019   CO2 31 (A) 11/29/2019   Lab Results  Component Value Date   ALT 26 11/29/2019   AST 34 11/29/2019   ALKPHOS 56 11/29/2019   Lab Results  Component Value Date   HGBA1C 5.6 12/26/2019   Lab Results  Component Value Date   INSULIN 13.4 12/26/2019   Lab Results  Component Value Date   CHOL 159 11/29/2019   HDL 53 11/29/2019   LDLCALC 85 11/29/2019   TRIG 108 11/29/2019   Lab Results  Component Value Date   WBC 6.5 12/26/2019   HGB 12.3 12/26/2019   HCT 38.3 12/26/2019   MCV 93 12/26/2019   PLT 384 12/26/2019   Lab Results  Component Value Date   IRON 42 12/26/2019   TIBC 390 12/26/2019   FERRITIN 16 12/26/2019   Attestation Statements:   Reviewed by clinician on day of visit: allergies, medications, problem list, medical history, surgical history, family history, social history, and previous encounter notes.  I, 12/28/2019, CMA, am acting as Insurance claims handler for Energy manager, DO.  I have reviewed the above documentation for accuracy and completeness, and I agree with the above. [employer], DO

## 2020-02-13 NOTE — Telephone Encounter (Signed)
Tried to return pt's call @ # below. No answer, received message stating wireless customer unavailable. No VM option.

## 2020-02-13 NOTE — Telephone Encounter (Signed)
I spoke with the pt and we discussed the savings card and her questions about Aimovig. Pt will go forward with attempt to fill and we will complete PA as pt has already received a sample in office. PA would likely be required for both. I asked pt to call us back if she is not able to fill the Ajovy and/or if she finds that it does not help and we can discuss possible switch to Aimovig. Pt was agreeable to the plan and verbalized appreciation.

## 2020-02-13 NOTE — Progress Notes (Signed)
Office: [phone]  /  Fax: [phone]    Date: Feb 21, 2020   Appointment Start Time: 11:02am Duration: 32 minutes Provider: Glennie Isle, Psy.D. Type of Session: Intake for Individual Therapy  Location of Patient: Home Location of Provider: Provider's Home Type of Contact: Telepsychological Visit via MyChart Video Visit  Informed Consent: Prior to proceeding with today's appointment, two pieces of identifying information were obtained. In addition, Angelica Young's physical location at the time of this appointment was obtained as well a phone number she could be reached at in the event of technical difficulties. Sister and this provider participated in today's telepsychological service.   The provider's role was explained to Angelica Young. The provider reviewed and discussed issues of confidentiality, privacy, and limits therein (e.g., reporting obligations). In addition to verbal informed consent, written informed consent for psychological services was obtained prior to the initial appointment. Since the clinic is not a 24/7 crisis center, mental health emergency resources were shared and this  provider explained MyChart, e-mail, voicemail, and/or other messaging systems should be utilized only for non-emergency reasons. This provider also explained that information obtained during appointments will be placed in Angelica Young's medical record and relevant information will be shared with other providers at [facility] for coordination of care. Moreover, Angelica Young agreed information may be shared with other [facility] providers as needed for coordination of care. By signing the service agreement document, Angelica Young provided written consent for coordination of care. Prior to initiating telepsychological services, Angelica Young completed an informed consent document, which included the development of a safety plan (i.e., an emergency contact, nearest emergency room, and emergency  resources) in the event of an emergency/crisis. Angelica Young expressed understanding of the rationale of the safety plan. Angelica Young verbally acknowledged understanding she is ultimately responsible for understanding her insurance benefits for telepsychological and in-person services. This provider also reviewed confidentiality, as it relates to telepsychological services, as well as the rationale for telepsychological services (i.e., to reduce exposure risk to COVID-19). Angelica Young  acknowledged understanding that appointments cannot be recorded without both party consent and she is aware she is responsible for securing confidentiality on her end of the session. Angelica Young verbally consented to proceed.  Chief Complaint/HPI: Angelica Young was referred by Dr. Briscoe Deutscher due to other depression, with emotional eating. Per the note for the visit with Dr. Briscoe Deutscher on Feb 13, 2020, "Angelica Young is struggling with emotional eating and using food for comfort to the extent that it is negatively impacting her health. She has been working on behavior modification techniques to help reduce her emotional eating and has been unsuccessful. She shows no sign of suicidal or homicidal ideations." The note for the initial appointment with Dr. Briscoe Deutscher December 21, 2019 indicated the following: "Angelica Young's habits were reviewed today and are as follows: Her family eats meals together, she thinks her family will eat healthier with her, her desired weight loss is 52 pounds, she has been heavy most of her life, she started gaining weight in 2008, her heaviest weight ever was 199 pounds, she craves sugary snacks, cakes, candy, and donuts, she snacks frequently in the evenings, she wakes up frequently in the middle of the night to eat, she frequently makes poor food choices, she frequently eats larger portions than normal and she struggles with emotional eating." Angelica Young's Food and Mood (modified PHQ-9) score on December 21, 2019 was 10.  During today's  appointment, Angelica Young was verbally administered a questionnaire assessing various behaviors related to emotional eating. Angelica Young  endorsed the following: overeat when you are celebrating, experience food cravings on a regular basis, eat certain foods when you are anxious, stressed, depressed, or your feelings are hurt, use food to help you cope with emotional situations, find food is comforting to you, overeat when you are worried about something, overeat frequently when you are bored or lonely, overeat when you are angry at someone just to show them they cannot control you, overeat when you are alone, but eat much less when you are with other people and eat as a reward. Angelica Young believes the onset of emotional eating was likely in college and described the current frequency of emotional eating as "every other day." In addition, Angelica Young denied a history of binge eating. Angelica Young denied a history of restricting food intake and purging and has never been diagnosed with an eating disorder. She also denied a history of treatment for emotional eating. She disclosed a history of laxative use once to twice a week, noting the last time was January 2021. Psychoeducation regarding consequences of laxative use was provided. Moreover, Angelica Young indicated stress triggers emotional eating, whereas talking to mother and exercising makes emotional eating better. Furthermore, Angelica Young denied other problems of concern.    Mental Status Examination:  Appearance: well groomed and appropriate hygiene  Behavior: appropriate to circumstances Mood: euthymic Affect: mood congruent Speech: normal in rate, volume, and tone Eye Contact: appropriate Psychomotor Activity: appropriate Gait: unable to assess Thought Process: linear, logical, and goal directed  Thought Content/Perception: denies suicidal and homicidal ideation, plan, and intent and no hallucinations, delusions, bizarre thinking or behavior reported or observed Orientation:  time, person, place and purpose of appointment Memory/Concentration: memory, attention, language, and fund of knowledge intact  Insight/Judgment: good  Family & Psychosocial History: Angelica Young reported she divorced in March 2021, and she does not have any children. 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 Additionally, Angelica Young shared her highest level of education obtained is a master's degree. Currently, Angelica Young's social support system consists of her mother, maternal aunt, two maternal cousins, and two really good friends. Moreover, Angelica Young stated she resides alone.  Medical History:  Past Medical History:  Diagnosis Date  . Anxiety   . Depression   . Endometriosis 2014  . Hypertension   . IGT (impaired glucose tolerance)   . Infertility, female   . Migraines   . Pre-diabetes   . Sleep apnea   . Vitamin D deficiency    Past Surgical History:  Procedure Laterality Date  . BREAST SURGERY     reduction 2009  . DILATION AND CURETTAGE OF UTERUS    . HERNIA REPAIR  2011  . LAPAROSCOPIC ASSISTED VENTRAL HERNIA REPAIR N/A 10/16/2019   Procedure: LAPAROSCOPIC ASSISTED VENTRAL HERNIA REPAIR WITH MESH;  Surgeon: Johnathan Hausen, MD;  Location: [facility];  Service: General;  Laterality: N/A;  . [place]  [id number]   miscarried   . PELVIC LAPAROSCOPY     x 4 for endometriosis   Current Outpatient Medications on File Prior to Visit  Medication Sig Dispense Refill  . chlorthalidone (HYGROTON) 25 MG tablet Take 25 mg by mouth daily.    . Cyanocobalamin (VITAMIN B 12) 500 MCG TABS Take 1 tablet by mouth daily.    Marland Kitchen FLUoxetine (PROZAC) 40 MG capsule Take 40 mg by mouth daily.    . Fremanezumab-vfrm (AJOVY) 225 MG/1.5ML SOAJ Inject 225 mg into the skin every 30 (thirty) days. 1 pen 11  . metFORMIN (GLUCOPHAGE) 500 MG tablet  Take 1 tablet (500 mg total) by mouth 2 (two) times daily. 60 tablet 1  . methylPREDNISolone (MEDROL DOSEPAK) 4 MG TBPK tablet Take pills in the morning  with food x 6 days. 21 tablet 1  . Misc Natural Products (JOINT SUPPORT COMPLEX PO) Take 1 capsule by mouth daily.    . Multiple Vitamin (MULTIVITAMIN PO) Take 30 mLs by mouth daily. Source of life    . naratriptan (AMERGE) 2.5 MG tablet Take 1 tablet (2.5 mg total) by mouth as needed for migraine. Take one (1) tablet at onset of headache; if returns or does not resolve, may repeat after 2 hours; do not exceed five (5) mg in 24 hours. 10 tablet 11  . omega-3 acid ethyl esters (LOVAZA) 1 g capsule Take 1 g by mouth daily.    . verapamil (CALAN-SR) 180 MG CR tablet Take 180 mg by mouth at bedtime.    Marland Kitchen VITAMIN D PO Take 5,000 Units by mouth daily.      No current facility-administered medications on file prior to visit.  Angelica Young denied a history of head injuries and loss of consciousness.    Mental Health History: Kaedence reported she attended therapeutic services in 1984 when her parents divorced. She indicated she met with a counselor again in 2013 to address situational stressors. She stated she has met with therapists "on and off" since 2013 for situational stressors. Angelica Young stated she last attended therapeutic services in December 2020. Angelica Young reported there is no history of hospitalizations for psychiatric concerns, and she has never met with a psychiatrist. Angelica Young stated she was prescribed Prozac by her PCP. Kaysa denied a family history of mental health related concerns. Janiesha reported there is no history of trauma including psychological, physical  and sexual abuse, as well as neglect.   Tiffony described her typical mood lately as "up and down." Aside from concerns noted above and endorsed on the PHQ-9 and GAD-7, Psalm reported experiencing crying spells and worry thoughts health and future. Cherica endorsed current alcohol use, noting she consumes one standard drink approximately twice a month. She denied tobacco use. She denied illicit/recreational substance use. Regarding caffeine  intake, Emiley reported consuming coffee twice a week. Furthermore, Denielle indicated she is not experiencing the following: hallucinations and delusions, paranoia, symptoms of mania , social withdrawal, panic attacks and decreased motivation. She also denied history of and current suicidal ideation, plan, and intent; history of and current homicidal ideation, plan, and intent; and history of and current engagement in self-harm.  The following strengths were reported by Pranathi: very organized and detail oriented. The following strengths were observed by this provider: ability to express thoughts and feelings during the therapeutic session, ability to establish and benefit from a therapeutic relationship, willingness to work toward established goal(s) with the clinic and ability to engage in reciprocal conversation.  Legal History: Jermiya reported there is no history of legal involvement.   Structured Assessments Results: The Patient Health Questionnaire-9 (PHQ-9) is a self-report measure that assesses symptoms and severity of depression over the course of the last two weeks. Glendola obtained a score of 9 suggesting mild depression. Loria finds the endorsed symptoms to be somewhat difficult. [0= Not at all; 1= Several days; 2= More than half the days; 3= Nearly every day] Little interest or pleasure in doing things 1  Feeling down, depressed, or hopeless 0  Trouble falling or staying asleep, or sleeping too much 1  Feeling tired or having little energy 1  Poor appetite  or overeating 2  Feeling bad about yourself --- or that you are a failure or have let yourself or your family down 2  Trouble concentrating on things, such as reading the newspaper or watching television 2  Moving or speaking so slowly that other people could have noticed? Or the opposite --- being so fidgety or restless that you have been moving around a lot more than usual 0  Thoughts that you would be better off dead or hurting  yourself in some way 0  PHQ-9 Score 9    The Generalized Anxiety Disorder-7 (GAD-7) is a brief self-report measure that assesses symptoms of anxiety over the course of the last two weeks. Sindee obtained a score of 10 suggesting moderate anxiety. Leya finds the endorsed symptoms to be very difficult. [0= Not at all; 1= Several days; 2= Over half the days; 3= Nearly every day] Feeling nervous, anxious, on edge 1  Not being able to stop or control worrying 2  Worrying too much about different things 1  Trouble relaxing 3  Being so restless that it's hard to sit still 1  Becoming easily annoyed or irritable 0  Feeling afraid as if something awful might happen 2  GAD-7 Score 10   Interventions:  Conducted a chart review Focused on rapport building Verbally administered PHQ-9 and GAD-7 for symptom monitoring Verbally administered Food & Mood questionnaire to assess various behaviors related to emotional eating Provided emphatic reflections and validation Collaborated with patient on a treatment goal  Psychoeducation provided regarding physical versus emotional hunger Recommended/discussed option for longer-term therapeutic services  Psychoeducation provided regarding laxative use  Provisional DSM-5 Diagnosis(es): 311 (F32.8) Other Specified Depressive Disorder, Emotional Eating Behaviors and 300.00 (F41.9) Unspecified Anxiety Disorder  Plan:This provider recommended longer-term therapeutic services based on recent events and current symptomatology and discussed options to establish care with a new provider. Terril provided verbal consent for this provider to place a referral to address ongoing stressors. She was receptive to continuing to meeting with this provider until established with a new provider. Brooks appears able and willing to participate as evidenced by collaboration on a treatment goal, engagement in reciprocal conversation, and asking questions as needed for clarification.  The next appointment will be scheduled in approximately two weeks, which will be via MyChart Video Visit. The following treatment goal was established: increase coping skills. This provider will regularly review the treatment plan and medical chart to keep informed of status changes. Francene expressed understanding and agreement with the initial treatment plan of care. Breea will be sent a handout via e-mail to utilize between now and the next appointment to increase awareness of hunger patterns and subsequent eating. Chandy provided verbal consent during today's appointment for this provider to send the handout via e-mail.

## 2020-02-14 ENCOUNTER — Other Ambulatory Visit: Payer: Self-pay

## 2020-02-15 ENCOUNTER — Telehealth: Payer: Self-pay | Admitting: *Deleted

## 2020-02-15 NOTE — Telephone Encounter (Signed)
Completed Ajovy PA on Cover My Meds. Key: [id number]. Awaiting [employer] determination. I believe this will be denied because insurance prefers Aimovig and Emgality trials however I already notified the pt that the savings card should still work for the [employer]. Will update once we have a determination on this PA.

## 2020-02-16 ENCOUNTER — Institutional Professional Consult (permissible substitution): Payer: BLUE CROSS/BLUE SHIELD | Admitting: Obstetrics and Gynecology

## 2020-02-19 ENCOUNTER — Institutional Professional Consult (permissible substitution): Payer: Managed Care, Other (non HMO) | Admitting: Internal Medicine

## 2020-02-19 NOTE — Telephone Encounter (Signed)
Received fax from [employer] stating Angelica Young has been denied. Require trial of Emgality and Aimovig first as suspected. Pt can use savings card to get the Ajovy regardless.   If we should choose to appeal, call number on back of insurance card for expedited appeal or for standard appeal send a written request to [place] and Appeals [id number], [place] [address]. Reference # [id number].

## 2020-02-21 ENCOUNTER — Telehealth: Payer: Self-pay | Admitting: Neurology

## 2020-02-21 ENCOUNTER — Telehealth (INDEPENDENT_AMBULATORY_CARE_PROVIDER_SITE_OTHER): Payer: BLUE CROSS/BLUE SHIELD | Admitting: Psychology

## 2020-02-21 ENCOUNTER — Other Ambulatory Visit: Payer: Self-pay

## 2020-02-21 DIAGNOSIS — F3289 Other specified depressive episodes: Secondary | ICD-10-CM | POA: Diagnosis not present

## 2020-02-21 DIAGNOSIS — F419 Anxiety disorder, unspecified: Secondary | ICD-10-CM

## 2020-02-21 NOTE — Telephone Encounter (Signed)
Patient is scheduled at [facility] for 05/14/20.

## 2020-02-22 NOTE — Progress Notes (Unsigned)
Office: [phone]  /  Fax: [phone]    Date: Mar 07, 2020   Appointment Start Time: *** Duration: *** minutes Provider: Lawerance Cruel, Psy.D. Type of Session: Individual Therapy  Location of Patient: {gbptloc:23249} Location of Provider: Provider's Home Type of Contact: Telepsychological Visit via MyChart Video Visit  Session Content: Angelica Young is a 39 y.o. female presenting via MyChart Video Visit for a follow-up appointment to address the previously established treatment goal of increasing coping skills. Today's appointment was a telepsychological visit due to COVID-19. Angelica Young provided verbal consent for today's telepsychological appointment and she is aware she is responsible for securing confidentiality on her end of the session. Prior to proceeding with today's appointment, Angelica Young physical location at the time of this appointment was obtained as well a phone number she could be reached at in the event of technical difficulties. Angelica Young and this provider participated in today's telepsychological service.   This provider conducted a brief check-in and verbally administered the PHQ-9 and GAD-7. *** Angelica Young was receptive to today's appointment as evidenced by openness to sharing, responsiveness to feedback, and {gbreceptiveness:23401}.  Mental Status Examination:  Appearance: {Appearance:22431} Behavior: {Behavior:22445} Mood: {gbmood:21757} Affect: {Affect:22436} Speech: {Speech:22432} Eye Contact: {Eye Contact:22433} Psychomotor Activity: {Motor Activity:22434} Gait: {gbgait:23404} Thought Process: {thought process:22448}  Thought Content/Perception: {disturbances:22451} Orientation: {Orientation:22437} Memory/Concentration: {gbcognition:22449} Insight/Judgment: {Insight:22446}  Structured Assessments Results: The Patient Health Questionnaire-9 (PHQ-9) is a self-report measure that assesses symptoms and severity of depression over the course of the last two weeks.  Angelica Young obtained a score of *** suggesting {GBPHQ9SEVERITY:21752}. Angelica Young finds the endorsed symptoms to be {gbphq9difficulty:21754}. [0= Not at all; 1= Several days; 2= More than half the days; 3= Nearly every day] Little interest or pleasure in doing things ***  Feeling down, depressed, or hopeless ***  Trouble falling or staying asleep, or sleeping too much ***  Feeling tired or having little energy ***  Poor appetite or overeating ***  Feeling bad about yourself --- or that you are a failure or have let yourself or your family down ***  Trouble concentrating on things, such as reading the newspaper or watching television ***  Moving or speaking so slowly that other people could have noticed? Or the opposite --- being so fidgety or restless that you have been moving around a lot more than usual ***  Thoughts that you would be better off dead or hurting yourself in some way ***  PHQ-9 Score ***    The Generalized Anxiety Disorder-7 (GAD-7) is a brief self-report measure that assesses symptoms of anxiety over the course of the last two weeks. Angelica Young obtained a score of *** suggesting {gbgad7severity:21753}. Angelica Young finds the endorsed symptoms to be {gbphq9difficulty:21754}. [0= Not at all; 1= Several days; 2= Over half the days; 3= Nearly every day] Feeling nervous, anxious, on edge ***  Not being able to stop or control worrying ***  Worrying too much about different things ***  Trouble relaxing ***  Being so restless that it's hard to sit still ***  Becoming easily annoyed or irritable ***  Feeling afraid as if something awful might happen ***  GAD-7 Score ***   Interventions:  {Interventions for Progress Notes:23405}  DSM-5 Diagnosis(es): 311 (F32.8) Other Specified Depressive Disorder, Emotional Eating Behaviors and 300.00 (F41.9) Unspecified Anxiety Disorder  Treatment Goal & Progress: During the initial appointment with this provider, the following treatment goal was  established: increase coping skills. Angelica Young has demonstrated progress in her goal as evidenced by {gbtxprogress:22839}. Angelica Young also {gbtxprogress2:22951}.  Plan: The  next appointment will be scheduled in {gbweeks:21758}, which will be {gbtxmodality:23402}. The next session will focus on {Plan for Next Appointment:23400}.

## 2020-02-28 ENCOUNTER — Telehealth: Payer: Self-pay | Admitting: Internal Medicine

## 2020-02-28 MED ORDER — FLUOXETINE HCL 40 MG PO CAPS: 40.0000 mg | ORAL_CAPSULE | Freq: Every day | ORAL | 0 refills | Status: DC

## 2020-02-28 NOTE — Addendum Note (Signed)
Addended by: Kern Reap B on: 02/28/2020 02:43 PM   Modules accepted: Orders

## 2020-02-28 NOTE — Telephone Encounter (Signed)
Pt call want a refill on FLUoxetine (PROZAC) 40 MG capsule but want a lower dose than  40mg  sent to  [id number] , [place] - [address] Phone:  [phone]  Fax:  [phone]

## 2020-02-28 NOTE — Telephone Encounter (Signed)
If she wants to change dose, will need an OV to discuss. OK to do virtual if she prefers.

## 2020-02-28 NOTE — Telephone Encounter (Signed)
Spoke with patient and she will discuss the medication at her physical in August.  She will continue the current dose until then.  Refill sent.

## 2020-02-29 ENCOUNTER — Other Ambulatory Visit (INDEPENDENT_AMBULATORY_CARE_PROVIDER_SITE_OTHER): Payer: Self-pay | Admitting: Family Medicine

## 2020-02-29 DIAGNOSIS — R7303 Prediabetes: Secondary | ICD-10-CM

## 2020-03-06 ENCOUNTER — Encounter (INDEPENDENT_AMBULATORY_CARE_PROVIDER_SITE_OTHER): Payer: Self-pay

## 2020-03-06 ENCOUNTER — Ambulatory Visit (INDEPENDENT_AMBULATORY_CARE_PROVIDER_SITE_OTHER): Payer: BLUE CROSS/BLUE SHIELD | Admitting: Family Medicine

## 2020-03-07 ENCOUNTER — Telehealth (INDEPENDENT_AMBULATORY_CARE_PROVIDER_SITE_OTHER): Payer: Self-pay | Admitting: Psychology

## 2020-03-12 NOTE — Telephone Encounter (Signed)
I spoke with [facility]. They received a PA block when they try to process the claim but I advised the staff member that the savings offer will override the PA block and change healthcare will pay for the medication less pt's $5 copay.

## 2020-03-15 ENCOUNTER — Institutional Professional Consult (permissible substitution): Payer: BLUE CROSS/BLUE SHIELD | Admitting: Obstetrics and Gynecology

## 2020-03-20 ENCOUNTER — Encounter (INDEPENDENT_AMBULATORY_CARE_PROVIDER_SITE_OTHER): Payer: Self-pay | Admitting: Family Medicine

## 2020-03-20 ENCOUNTER — Other Ambulatory Visit: Payer: Self-pay

## 2020-03-20 ENCOUNTER — Ambulatory Visit (INDEPENDENT_AMBULATORY_CARE_PROVIDER_SITE_OTHER): Payer: BLUE CROSS/BLUE SHIELD | Admitting: Family Medicine

## 2020-03-20 VITALS — BP 122/89 | HR 73 | Temp 98.6°F | Ht 62.0 in | Wt 178.0 lb

## 2020-03-20 DIAGNOSIS — R7303 Prediabetes: Secondary | ICD-10-CM

## 2020-03-20 DIAGNOSIS — E8881 Metabolic syndrome: Secondary | ICD-10-CM | POA: Diagnosis not present

## 2020-03-20 DIAGNOSIS — F3289 Other specified depressive episodes: Secondary | ICD-10-CM

## 2020-03-20 DIAGNOSIS — E559 Vitamin D deficiency, unspecified: Secondary | ICD-10-CM | POA: Diagnosis not present

## 2020-03-20 DIAGNOSIS — Z9189 Other specified personal risk factors, not elsewhere classified: Secondary | ICD-10-CM

## 2020-03-20 DIAGNOSIS — Z6832 Body mass index (BMI) 32.0-32.9, adult: Secondary | ICD-10-CM

## 2020-03-20 DIAGNOSIS — E669 Obesity, unspecified: Secondary | ICD-10-CM

## 2020-03-20 MED ORDER — FLUOXETINE HCL 40 MG PO CAPS: 40.0000 mg | ORAL_CAPSULE | Freq: Every day | ORAL | 0 refills | Status: DC

## 2020-03-20 MED ORDER — METFORMIN HCL 500 MG PO TABS: 500.0000 mg | ORAL_TABLET | Freq: Two times a day (BID) | ORAL | 1 refills | Status: DC

## 2020-03-20 MED ORDER — VITAMIN D (ERGOCALCIFEROL) 1.25 MG (50000 UNIT) PO CAPS: 50000.0000 [IU] | ORAL_CAPSULE | ORAL | 0 refills | Status: DC

## 2020-03-21 ENCOUNTER — Institutional Professional Consult (permissible substitution): Payer: BLUE CROSS/BLUE SHIELD | Admitting: Obstetrics and Gynecology

## 2020-03-21 NOTE — Progress Notes (Signed)
Chief Complaint:   OBESITY Angelica Young is here to discuss her progress with her obesity treatment plan along with follow-up of her obesity related diagnoses. Angelica Young is on the Category 2 Plan and states she is following her eating plan approximately 50% of the time. Clancy states she is on the treadmill and lifting weights for 30-60 minutes 4 times per week.  Today's visit was #: 4 Starting weight: 182 lbs Starting date: 12/21/2019 Today's weight: 178 lbs Today's date: 03/20/2020 Total lbs lost to date: 4 Total lbs lost since last in-office visit: 1  Interim History: Felicity started exercising 3 days per week recently. She is following breakfast and lunch to the tee, and dinner is difficult due to eating with her mother. She seen Dr. Dewaine Conger, and she was told that she needs cognitive behavioral therapy and was referred out. She has been on Prozac for 2 years now, and in December 2020 it was increased from 20 mg to 40 mg. She recently got a divorce and has moved into an apartment near her mother. She notes increased stressors.  Subjective:   1. Pre-diabetes Dalores is working on diet and weight loss. I reviewed labs with the patient today again and education was provided.  2. Insulin resistance Whittany's last insulin level was 13.4 on 12/26/2019. I reviewed labs with the patient today again and education was provided.  3. Vitamin D deficiency I discussed OTC oral Vit D with Maranatha, and changing to prescription Vit D once weekly which she has had in the past and it work well.  4. Other depression with emotional eating Charlott notes some stressors. She will call to schedule an appointment with a Psychologist. We discussed her medications is not the only a way to help manage stress. Counseling, sleep, diet, exercise, meditation, and prayer was recommended.  5. At risk for diabetes mellitus Svetlana is at higher than average risk for developing diabetes due to her obesity.    Assessment/Plan:   1. Pre-diabetes Charlei will continue to work on weight loss, exercise, and decreasing simple carbohydrates to help decrease the risk of diabetes. We will refill metformin for 2 months.  - metFORMIN (GLUCOPHAGE) 500 MG tablet; Take 1 tablet (500 mg total) by mouth 2 (two) times daily.  Dispense: 60 tablet; Refill: 1  2. Insulin resistance Chiann will continue to work on weight loss, exercise, and decreasing simple carbohydrates to help decrease the risk of diabetes. We will refill metformin for 2 months. Vietta agreed to follow-up with [place] as directed to closely monitor her progress.  - metFORMIN (GLUCOPHAGE) 500 MG tablet; Take 1 tablet (500 mg total) by mouth 2 (two) times daily.  Dispense: 60 tablet; Refill: 1  3. Vitamin D deficiency Low Vitamin D level contributes to fatigue and are associated with obesity, breast, and colon cancer. Shamyra agreed to start prescription Vitamin D 50,000 IU every week with no refills. She will follow-up for routine testing of Vitamin D, at least 2-3 times per year to avoid over-replacement.  - Vitamin D, Ergocalciferol, (DRISDOL) 1.25 MG (50000 UNIT) CAPS capsule; Take 1 capsule (50,000 Units total) by mouth every 7 (seven) days.  Dispense: 4 capsule; Refill: 0  4. Other depression with emotional eating Behavior modification techniques were discussed today to help Letisia deal with her emotional/non-hunger eating behaviors. We will refill Prozac for 1 month. Orders and follow up as documented in patient record.   - FLUoxetine (PROZAC) 40 MG capsule; Take 1 capsule (40 mg total) by mouth  daily.  Dispense: 30 capsule; Refill: 0  5. At risk for diabetes mellitus Thula was given approximately 30 minutes of diabetes education and counseling today. We discussed intensive lifestyle modifications today with an emphasis on weight loss as well as increasing exercise and decreasing simple carbohydrates in her diet. We also reviewed medication  options with an emphasis on risk versus benefit of those discussed.   Repetitive spaced learning was employed today to elicit superior memory formation and behavioral change.  6. Class 1 obesity with serious comorbidity and body mass index (BMI) of 32.0 to 32.9 in adult, unspecified obesity type Skylynne is currently in the action stage of change. As such, her goal is to continue with weight loss efforts. She has agreed to the Category 2 Plan.   Exercise goals: As is.  Behavioral modification strategies: increasing lean protein intake, meal planning and cooking strategies and avoiding temptations.  Rhylen has agreed to follow-up with our clinic in 3 weeks. She was informed of the importance of frequent follow-up visits to maximize her success with intensive lifestyle modifications for her multiple health conditions.   Objective:   Blood pressure 122/89, pulse 73, temperature 98.6 F (37 C), temperature source Oral, height 5\' 2"  (1.575 m), weight 178 lb (80.7 kg), last menstrual period 03/13/2020, SpO2 99 %. Body mass index is 32.56 kg/m.  General: Cooperative, alert, well developed, in no acute distress. HEENT: Conjunctivae and lids unremarkable. Cardiovascular: Regular rhythm.  Lungs: Normal work of breathing. Neurologic: No focal deficits.   Lab Results  Component Value Date   CREATININE 0.6 11/29/2019   BUN 9 11/29/2019   NA 137 10/12/2019   K 4.0 11/29/2019   CL 101 11/29/2019   CO2 31 (A) 11/29/2019   Lab Results  Component Value Date   ALT 26 11/29/2019   AST 34 11/29/2019   ALKPHOS 56 11/29/2019   Lab Results  Component Value Date   HGBA1C 5.6 12/26/2019   Lab Results  Component Value Date   INSULIN 13.4 12/26/2019   No results found for: TSH Lab Results  Component Value Date   CHOL 159 11/29/2019   HDL 53 11/29/2019   LDLCALC 85 11/29/2019   TRIG 108 11/29/2019   Lab Results  Component Value Date   WBC 6.5 12/26/2019   HGB 12.3 12/26/2019   HCT  38.3 12/26/2019   MCV 93 12/26/2019   PLT 384 12/26/2019   Lab Results  Component Value Date   IRON 42 12/26/2019   TIBC 390 12/26/2019   FERRITIN 16 12/26/2019   Attestation Statements:   Reviewed by clinician on day of visit: allergies, medications, problem list, medical history, surgical history, family history, social history, and previous encounter notes.   I, Trixie Dredge, am acting as transcriptionist for Dennard Nip, MD.  I have reviewed the above documentation for accuracy and completeness, and I agree with the above. -  Dennard Nip, MD

## 2020-04-02 ENCOUNTER — Telehealth: Payer: Self-pay | Admitting: *Deleted

## 2020-04-02 MED ORDER — MEGESTROL ACETATE 40 MG PO TABS: 40.0000 mg | ORAL_TABLET | Freq: Two times a day (BID) | ORAL | 0 refills | Status: DC

## 2020-04-02 NOTE — Telephone Encounter (Signed)
Patient called requesting Rx for megace to help with heavy bleeding. Reports LMP: 3 weeks ago, lasted 4 days. This bleeding started on 03/29/20 wearing tampon and pads changing every 2 hours with clots. History of endometriosis, will be leaving for [facility] on Thursday for a funeral, states taking megace bid x 5 day normally helps stop the bleeding. Please advise

## 2020-04-02 NOTE — Telephone Encounter (Signed)
Left detailed message on cell, Rx sent. 

## 2020-04-02 NOTE — Telephone Encounter (Signed)
Okay 40 mg megace bid x 5 days. Thank you

## 2020-04-10 ENCOUNTER — Ambulatory Visit (INDEPENDENT_AMBULATORY_CARE_PROVIDER_SITE_OTHER): Payer: BLUE CROSS/BLUE SHIELD | Admitting: Family Medicine

## 2020-04-10 DIAGNOSIS — F4320 Adjustment disorder, unspecified: Secondary | ICD-10-CM | POA: Diagnosis not present

## 2020-04-11 ENCOUNTER — Other Ambulatory Visit (INDEPENDENT_AMBULATORY_CARE_PROVIDER_SITE_OTHER): Payer: Self-pay | Admitting: Family Medicine

## 2020-04-11 DIAGNOSIS — E559 Vitamin D deficiency, unspecified: Secondary | ICD-10-CM

## 2020-04-16 ENCOUNTER — Encounter (INDEPENDENT_AMBULATORY_CARE_PROVIDER_SITE_OTHER): Payer: Self-pay

## 2020-04-16 ENCOUNTER — Encounter: Payer: Self-pay | Admitting: Internal Medicine

## 2020-04-16 MED ORDER — CHLORTHALIDONE 25 MG PO TABS: 25.0000 mg | ORAL_TABLET | Freq: Every day | ORAL | 1 refills | Status: AC

## 2020-04-17 DIAGNOSIS — F4320 Adjustment disorder, unspecified: Secondary | ICD-10-CM | POA: Diagnosis not present

## 2020-04-24 ENCOUNTER — Other Ambulatory Visit: Payer: Self-pay

## 2020-04-24 ENCOUNTER — Ambulatory Visit (INDEPENDENT_AMBULATORY_CARE_PROVIDER_SITE_OTHER): Payer: BLUE CROSS/BLUE SHIELD | Admitting: Family Medicine

## 2020-04-24 ENCOUNTER — Encounter (INDEPENDENT_AMBULATORY_CARE_PROVIDER_SITE_OTHER): Payer: Self-pay | Admitting: Family Medicine

## 2020-04-24 VITALS — BP 125/91 | HR 70 | Temp 98.0°F | Ht 62.0 in | Wt 177.0 lb

## 2020-04-24 DIAGNOSIS — R7303 Prediabetes: Secondary | ICD-10-CM

## 2020-04-24 DIAGNOSIS — E669 Obesity, unspecified: Secondary | ICD-10-CM

## 2020-04-24 DIAGNOSIS — Z9189 Other specified personal risk factors, not elsewhere classified: Secondary | ICD-10-CM

## 2020-04-24 DIAGNOSIS — I10 Essential (primary) hypertension: Secondary | ICD-10-CM | POA: Diagnosis not present

## 2020-04-24 DIAGNOSIS — E559 Vitamin D deficiency, unspecified: Secondary | ICD-10-CM

## 2020-04-24 DIAGNOSIS — Z6832 Body mass index (BMI) 32.0-32.9, adult: Secondary | ICD-10-CM

## 2020-04-24 MED ORDER — VITAMIN D (ERGOCALCIFEROL) 1.25 MG (50000 UNIT) PO CAPS: 50000.0000 [IU] | ORAL_CAPSULE | ORAL | 0 refills | Status: DC

## 2020-04-24 NOTE — Progress Notes (Signed)
Chief Complaint:   OBESITY  Angelica Young is here to discuss her progress with her obesity treatment plan along with follow-up of her obesity related diagnoses. Angelica Young is on the Category 2 Plan and states she is following her eating plan approximately 60% of the time. Angelica Young states she is doing cardio for 35-40 minutes 3 times per week.  Today's visit was #: 5 Starting weight: 182 lbs Starting date: 12/21/2019 Today's weight: 177 lbs Today's date: 04/24/2020 Total lbs lost to date: 5 lbs Total lbs lost since last in-office visit: 1 lb  Interim History: Angelica Young says she has been eating more fruits and vegetables lately.  She states she is addicted to sugar and needs to eat cherries, watermelon, etc.  She is not following the nutritional plan in terms of getting all of her protein in.    Subjective:   1. Essential hypertension Review: taking medications as instructed, no medication side effects noted, no chest pain on exertion, no dyspnea on exertion, no swelling of ankles.  Blood pressure at home running 120-130/90-96.  No symptoms, but concerned.  She has not seen her PCP in awhile.  BP Readings from Last 3 Encounters:  04/24/20 (!) 125/91  03/20/20 122/89  02/12/20 124/89   2. Vitamin D deficiency Angelica Young's Vitamin D level was 48.5 on 12/26/2019. She is currently taking prescription vitamin D 50,000 IU each week. She denies nausea, vomiting or muscle weakness.  3. Prediabetes Angelica Young has a diagnosis of prediabetes based on her elevated HgA1c and was informed this puts her at greater risk of developing diabetes. She continues to work on diet and exercise to decrease her risk of diabetes. She denies nausea or hypoglycemia.  Metformin helps with hunger.  She is tolerating it well.  Lab Results  Component Value Date   HGBA1C 5.6 12/26/2019   Lab Results  Component Value Date   INSULIN 13.4 12/26/2019   4. At risk for diabetes mellitus Angelica Young is at higher than average risk for  developing diabetes due to her obesity.   Assessment/Plan:   1. Essential hypertension Angelica Young is working on healthy weight loss and exercise to improve blood pressure control. We will watch for signs of hypotension as she continues her lifestyle modifications.  [name] has an appointment with her PCP in the next couple of weeks.  She will discuss medications with her doctor then.  2. Vitamin D deficiency Low Vitamin D level contributes to fatigue and are associated with obesity, breast, and colon cancer. She agrees to continue to take prescription Vitamin D @50 ,000 IU every week and will follow-up for routine testing of Vitamin D, at least 2-3 times per year to avoid over-replacement. - Vitamin D, Ergocalciferol, (DRISDOL) 1.25 MG (50000 UNIT) CAPS capsule; Take 1 capsule (50,000 Units total) by mouth every 7 (seven) days.  Dispense: 4 capsule; Refill: 0  3. Prediabetes Angelica Young will continue to work on weight loss, exercise, and decreasing simple carbohydrates to help decrease the risk of diabetes.   4. At risk for diabetes mellitus Angelica Young was given approximately 15 minutes of diabetes education and counseling today. We discussed intensive lifestyle modifications today with an emphasis on weight loss as well as increasing exercise and decreasing simple carbohydrates in her diet. We also reviewed medication options with an emphasis on risk versus benefit of those discussed.   Repetitive spaced learning was employed today to elicit superior memory formation and behavioral change.  5. Class 1 obesity with serious comorbidity and body mass index (BMI)  of 32.0 to 32.9 in adult, unspecified obesity type Angelica Young is currently in the action stage of change. As such, her goal is to continue with weight loss efforts. She has agreed to the Category 2 Plan.   Exercise goals: As is.  Behavioral modification strategies: increasing lean protein intake, decreasing simple carbohydrates, better snacking  choices and planning for success.  Angelica Young has agreed to follow-up with our clinic in 2 weeks. She was informed of the importance of frequent follow-up visits to maximize her success with intensive lifestyle modifications for her multiple health conditions.   Objective:   Blood pressure (!) 125/91, pulse 70, temperature 98 F (36.7 C), temperature source Oral, height 5\' 2"  (1.575 m), weight 177 lb (80.3 kg), last menstrual period 04/20/2020, SpO2 99 %. Body mass index is 32.37 kg/m.  General: Cooperative, alert, well developed, in no acute distress. HEENT: Conjunctivae and lids unremarkable. Cardiovascular: Regular rhythm.  Lungs: Normal work of breathing. Neurologic: No focal deficits.   Lab Results  Component Value Date   CREATININE 0.6 11/29/2019   BUN 9 11/29/2019   NA 137 10/12/2019   K 4.0 11/29/2019   CL 101 11/29/2019   CO2 31 (A) 11/29/2019   Lab Results  Component Value Date   ALT 26 11/29/2019   AST 34 11/29/2019   ALKPHOS 56 11/29/2019   Lab Results  Component Value Date   HGBA1C 5.6 12/26/2019   Lab Results  Component Value Date   INSULIN 13.4 12/26/2019   No results found for: TSH Lab Results  Component Value Date   CHOL 159 11/29/2019   HDL 53 11/29/2019   LDLCALC 85 11/29/2019   TRIG 108 11/29/2019   Lab Results  Component Value Date   WBC 6.5 12/26/2019   HGB 12.3 12/26/2019   HCT 38.3 12/26/2019   MCV 93 12/26/2019   PLT 384 12/26/2019   Lab Results  Component Value Date   IRON 42 12/26/2019   TIBC 390 12/26/2019   FERRITIN 16 12/26/2019   Attestation Statements:   Reviewed by clinician on day of visit: allergies, medications, problem list, medical history, surgical history, family history, social history, and previous encounter notes.  I, 12/28/2019, CMA, am acting as Insurance claims handler for Energy manager, DO.  I have reviewed the above documentation for accuracy and completeness, and I agree with the above. [employer],  DO

## 2020-04-25 ENCOUNTER — Other Ambulatory Visit (INDEPENDENT_AMBULATORY_CARE_PROVIDER_SITE_OTHER): Payer: Self-pay | Admitting: Family Medicine

## 2020-04-25 DIAGNOSIS — F4320 Adjustment disorder, unspecified: Secondary | ICD-10-CM | POA: Diagnosis not present

## 2020-04-25 DIAGNOSIS — E559 Vitamin D deficiency, unspecified: Secondary | ICD-10-CM

## 2020-04-26 ENCOUNTER — Telehealth: Payer: Self-pay | Admitting: Neurology

## 2020-04-26 NOTE — Telephone Encounter (Signed)
Pt called and canceled MRI appt and stated that she would like to schedule out about 2 months due to not having the finances for it now. Please advise.

## 2020-04-28 NOTE — Telephone Encounter (Signed)
Angelica Young, would you schedule her an appointment with Megan or Amy please in 3 months thanks

## 2020-05-06 NOTE — Telephone Encounter (Signed)
I spoke with the patient about r/s her MRI she states she wants to see Amy first and then go from there.

## 2020-05-08 DIAGNOSIS — L732 Hidradenitis suppurativa: Secondary | ICD-10-CM | POA: Diagnosis not present

## 2020-05-08 DIAGNOSIS — L700 Acne vulgaris: Secondary | ICD-10-CM | POA: Diagnosis not present

## 2020-05-08 DIAGNOSIS — F4320 Adjustment disorder, unspecified: Secondary | ICD-10-CM | POA: Diagnosis not present

## 2020-05-14 ENCOUNTER — Encounter: Payer: Managed Care, Other (non HMO) | Admitting: Internal Medicine

## 2020-05-14 ENCOUNTER — Other Ambulatory Visit: Payer: BLUE CROSS/BLUE SHIELD

## 2020-05-15 DIAGNOSIS — Z0000 Encounter for general adult medical examination without abnormal findings: Secondary | ICD-10-CM | POA: Diagnosis not present

## 2020-05-15 DIAGNOSIS — D500 Iron deficiency anemia secondary to blood loss (chronic): Secondary | ICD-10-CM | POA: Diagnosis not present

## 2020-05-15 DIAGNOSIS — R7303 Prediabetes: Secondary | ICD-10-CM | POA: Diagnosis not present

## 2020-05-15 DIAGNOSIS — Z13220 Encounter for screening for lipoid disorders: Secondary | ICD-10-CM | POA: Diagnosis not present

## 2020-05-16 ENCOUNTER — Ambulatory Visit (INDEPENDENT_AMBULATORY_CARE_PROVIDER_SITE_OTHER): Payer: BLUE CROSS/BLUE SHIELD | Admitting: Adult Health

## 2020-05-16 ENCOUNTER — Encounter (INDEPENDENT_AMBULATORY_CARE_PROVIDER_SITE_OTHER): Payer: Self-pay | Admitting: Adult Health

## 2020-05-16 ENCOUNTER — Other Ambulatory Visit: Payer: Self-pay

## 2020-05-16 VITALS — BP 114/85 | HR 87 | Temp 98.1°F | Ht 62.0 in | Wt 176.0 lb

## 2020-05-16 DIAGNOSIS — Z6834 Body mass index (BMI) 34.0-34.9, adult: Secondary | ICD-10-CM | POA: Insufficient documentation

## 2020-05-16 DIAGNOSIS — Z6832 Body mass index (BMI) 32.0-32.9, adult: Secondary | ICD-10-CM

## 2020-05-16 DIAGNOSIS — I10 Essential (primary) hypertension: Secondary | ICD-10-CM | POA: Diagnosis not present

## 2020-05-16 DIAGNOSIS — E669 Obesity, unspecified: Secondary | ICD-10-CM

## 2020-05-16 DIAGNOSIS — R7303 Prediabetes: Secondary | ICD-10-CM

## 2020-05-16 NOTE — Progress Notes (Signed)
Chief Complaint:   OBESITY Angelica Young is here to discuss her progress with her obesity treatment plan along with follow-up of her obesity related diagnoses. [name] is on the Category 2 Plan and states she is following her eating plan approximately 10% of the time. [name] states she is walking 35 minutes 3 times per week.  Today's visit was #: 6 Starting weight: 182 lbs Starting date: 12/21/2019 Today's weight: 176 lbs Today's date: 05/16/2020 Total lbs lost to date: 6 Total lbs lost since last in-office visit: 1  Interim History: Angelica Young was challenged to follow the Category 2 meal plan due to her hypertension (concerns with consuming lunch meat and microwave meals with high sodium content) and taking Megace for endometriosis. She would like to discuss other eating plan options. She had her annual physical with fasting labs with her new PCP yesterday - labs have yet to post [facility] practice).  Subjective:   Essential hypertension. Blood pressure is well controlled at today's office visit. She is on chlorthalidone 25 mg daily and verapamil 180 mg daily.  BP Readings from Last 3 Encounters:  05/16/20 114/85  04/24/20 (!) 125/91  03/20/20 122/89   Lab Results  Component Value Date   CREATININE 0.6 11/29/2019   CREATININE 0.60 10/12/2019   Prediabetes. [name] has a diagnosis of prediabetes based on her elevated HgA1c and was informed this puts her at greater risk of developing diabetes. She continues to work on diet and exercise to decrease her risk of diabetes. She denies nausea or hypoglycemia. [name] is on metformin 500 mg BID, which she is tolerating well.  Lab Results  Component Value Date   HGBA1C 5.6 12/26/2019   Lab Results  Component Value Date   INSULIN 13.4 12/26/2019   Assessment/Plan:   Essential hypertension. Angelica Young is working on healthy weight loss and exercise to improve blood pressure control. We will watch for signs of hypotension as she continues  her lifestyle modifications. She will continue her current anti-hypertensive therapy.  Prediabetes. Angelica Young will continue to work on weight loss, exercise, and decreasing simple carbohydrates to help decrease the risk of diabetes. She will continue her current metformin regimen and will decrease simple carbohydrates.  Class 1 obesity with serious comorbidity and body mass index (BMI) of 32.0 to 32.9 in adult, unspecified obesity type.  [name] is currently in the action stage of change. As such, her goal is to continue with weight loss efforts. She has agreed to the Category 2 Plan and journaling 1100-1200 calories and 85 grams of protein.   Exercise goals: Angelica Young will continue her current exercise regimen.   Behavioral modification strategies: increasing lean protein intake, decreasing simple carbohydrates, increasing vegetables, meal planning and cooking strategies, planning for success and keeping a strict food journal.  Angelica Young has agreed to follow-up with our clinic in 2 weeks. She was informed of the importance of frequent follow-up visits to maximize her success with intensive lifestyle modifications for her multiple health conditions.   Objective:   Blood pressure 114/85, pulse 87, temperature 98.1 F (36.7 C), height 5\' 2"  (1.575 m), weight 176 lb (79.8 kg), last menstrual period 04/20/2020, SpO2 96 %. Body mass index is 32.19 kg/m.  General: Cooperative, alert, well developed, in no acute distress. HEENT: Conjunctivae and lids unremarkable. Cardiovascular: Regular rhythm.  Lungs: Normal work of breathing. Neurologic: No focal deficits.   Lab Results  Component Value Date   CREATININE 0.6 11/29/2019   BUN 9 11/29/2019   NA 137 10/12/2019  K 4.0 11/29/2019   CL 101 11/29/2019   CO2 31 (A) 11/29/2019   Lab Results  Component Value Date   ALT 26 11/29/2019   AST 34 11/29/2019   ALKPHOS 56 11/29/2019   Lab Results  Component Value Date   HGBA1C 5.6 12/26/2019    Lab Results  Component Value Date   INSULIN 13.4 12/26/2019   No results found for: TSH Lab Results  Component Value Date   CHOL 159 11/29/2019   HDL 53 11/29/2019   LDLCALC 85 11/29/2019   TRIG 108 11/29/2019   Lab Results  Component Value Date   WBC 6.5 12/26/2019   HGB 12.3 12/26/2019   HCT 38.3 12/26/2019   MCV 93 12/26/2019   PLT 384 12/26/2019   Lab Results  Component Value Date   IRON 42 12/26/2019   TIBC 390 12/26/2019   FERRITIN 16 12/26/2019   Attestation Statements:   Reviewed by clinician on day of visit: allergies, medications, problem list, medical history, surgical history, family history, social history, and previous encounter notes.  Time spent on visit including pre-visit chart review and post-visit charting and care was 29 minutes.   I, Marianna Payment, am acting as Energy manager for [employer], NP-C   I have reviewed the above documentation for accuracy and completeness, and I agree with the above. - Julaine Fusi, NP

## 2020-05-20 ENCOUNTER — Ambulatory Visit: Payer: BLUE CROSS/BLUE SHIELD | Admitting: Obstetrics and Gynecology

## 2020-05-20 ENCOUNTER — Encounter: Payer: Self-pay | Admitting: Obstetrics and Gynecology

## 2020-05-20 ENCOUNTER — Other Ambulatory Visit: Payer: Self-pay

## 2020-05-20 VITALS — BP 118/74

## 2020-05-20 DIAGNOSIS — Z8742 Personal history of other diseases of the female genital tract: Secondary | ICD-10-CM

## 2020-05-20 DIAGNOSIS — N939 Abnormal uterine and vaginal bleeding, unspecified: Secondary | ICD-10-CM | POA: Diagnosis not present

## 2020-05-20 MED ORDER — MEGESTROL ACETATE 40 MG PO TABS: 40.0000 mg | ORAL_TABLET | Freq: Two times a day (BID) | ORAL | 0 refills | Status: DC

## 2020-05-20 MED ORDER — NORETHINDRONE 0.35 MG PO TABS
1.0000 | ORAL_TABLET | Freq: Every day | ORAL | 4 refills | Status: DC
Start: 2020-05-20 — End: 2020-06-24

## 2020-05-20 NOTE — Progress Notes (Signed)
Angelica Young 13-Mar-1981 [id number]  SUBJECTIVE:  39 y.o. G50P0020 female with reported history of endometriosis who returns for management recommendations regarding abnormal uterine bleeding.  She has heavy periods which had been monthly but she has been getting more than one period per month in the last few months.  Typically also passing clots.  Megace stops the bleeding.  She did have another episode in June and called in and was given a prescription for Megace x 5 days which did help control the symptoms.  She has had another episode here more recently, also citing that she has been under a lot of stress with her mother moving into her place temporarily.  Bleeding is slightly better this month as she is only changing a pad 3 times per day rather than every hour or two as she was at the other month.  Not complaining of a lot of dysmenorrhea or pelvic pain in general. She does have hypertension managed with antihypertensives, also history of migraine headaches with aura.   Current Outpatient Medications  Medication Sig Dispense Refill  . chlorthalidone (HYGROTON) 25 MG tablet Take 1 tablet (25 mg total) by mouth daily. 90 tablet 1  . Cyanocobalamin (VITAMIN B 12) 500 MCG TABS Take 1 tablet by mouth daily.    Marland Kitchen FLUoxetine (PROZAC) 40 MG capsule Take 1 capsule (40 mg total) by mouth daily. 30 capsule 0  . metFORMIN (GLUCOPHAGE) 500 MG tablet Take 1 tablet (500 mg total) by mouth 2 (two) times daily. 60 tablet 1  . Misc Natural Products (JOINT SUPPORT COMPLEX PO) Take 1 capsule by mouth daily.    . Multiple Vitamin (MULTIVITAMIN PO) Take 30 mLs by mouth daily. Source of life    . verapamil (CALAN-SR) 180 MG CR tablet Take 180 mg by mouth at bedtime.    . Vitamin D, Ergocalciferol, (DRISDOL) 1.25 MG (50000 UNIT) CAPS capsule Take 1 capsule (50,000 Units total) by mouth every 7 (seven) days. 4 capsule 0  . Fremanezumab-vfrm (AJOVY) 225 MG/1.5ML SOAJ Inject 225 mg into the skin every 30 (thirty)  days. (Patient not taking: Reported on 05/16/2020) 1 pen 11  . megestrol (MEGACE) 40 MG tablet Take 1 tablet (40 mg total) by mouth 2 (two) times daily. (Patient not taking: Reported on 05/20/2020) 10 tablet 0  . naratriptan (AMERGE) 2.5 MG tablet Take 1 tablet (2.5 mg total) by mouth as needed for migraine. Take one (1) tablet at onset of headache; if returns or does not resolve, may repeat after 2 hours; do not exceed five (5) mg in 24 hours. (Patient not taking: Reported on 05/20/2020) 10 tablet 11  . omega-3 acid ethyl esters (LOVAZA) 1 g capsule Take 1 g by mouth daily. (Patient not taking: Reported on 05/20/2020)     No current facility-administered medications for this visit.   Allergies: Vicodin [hydrocodone-acetaminophen] and Sulfa antibiotics  Patient's last menstrual period was 04/20/2020.  Past medical history,surgical history, problem list, medications, allergies, family history and social history were all reviewed and documented as reviewed in the EPIC chart.  ROS:  Feeling well. No dyspnea or chest pain on exertion.  No abdominal pain, change in bowel habits, black or bloody stools.  No urinary tract symptoms. GYN ROS: As described in HPI  OBJECTIVE:  BP 118/74   LMP 04/20/2020  The patient appears well, alert, oriented x 3, in no distress. PELVIC EXAM: Deferred due to consultative nature of visit    ASSESSMENT:  39 y.o. G2P0020 here for abnormal uterine  bleeding  PLAN:  We discussed various options for control of abnormal uterine bleeding, particularly in the context of endometriosis.  We discussed contraceptive options without estrogen given her other medical history.  We reviewed norethindrone, Nexplanon, Depo-Provera injection, levonorgestrel IUD.  Reviewed tranexamic acid, but this is not going to be is likely to help her if she is having more than one period per month. For now we will use Megace again 40 mg twice daily x5 days to control the acute bleeding episode and then  transition over to norethindrone 0.35mg  daily.  Not sexually active right now, but we reviewed importance of regular dosing day-to-day for contraceptive effect.  We reviewed norethindrone does not have as much evidence for efficacy in the setting of endometriosis.  We will try this for 2 to 3 months and if no improvement in the bleeding, we want to consider checking a TSH and a pelvic ultrasound and endometrial biopsy at that point and she may want to try the Mirena IUD.  She agrees with the plan.  Prescriptions are given for the Megace and norethindrone today. RTC 2 to 3 months.   Theresia Majors MD 05/20/20

## 2020-06-04 ENCOUNTER — Encounter: Payer: Self-pay | Admitting: Obstetrics and Gynecology

## 2020-06-04 ENCOUNTER — Other Ambulatory Visit: Payer: Self-pay

## 2020-06-04 MED ORDER — MEGESTROL ACETATE 40 MG PO TABS: ORAL_TABLET | ORAL | 0 refills | Status: DC

## 2020-06-04 NOTE — Telephone Encounter (Signed)
We could either try an ultrasound and endometrial biopsy which would potentially provide diagnosis but not treat the problem. Or if she wanted to consider going for hysteroscopy D&C, this would provide a diagnosis (if any abnormal tissue such as polyps are present) and also probably would temporarily reduce the amount of bleeding she is having.  Also could consider putting in an IUD at that time. She can either make an appointment virtual (end of AM or end of PM schedule) or in person to discuss further so we can help her with the next steps

## 2020-06-04 NOTE — Telephone Encounter (Signed)
Yet lets have her refill of Megace 40 mg twice daily x1 month and stop the norethindrone and then let's see her for an appointment

## 2020-06-05 ENCOUNTER — Telehealth: Payer: Self-pay

## 2020-06-05 NOTE — Telephone Encounter (Signed)
In the My Chart e-mail shuffle yesterday patient wrote "I will look forward to the appointment.  Can it be a virtual appointment since it will just be a discussion to discuss next steps?  I would like to do the [facility] and hsysteroscopy  Is a virtual appointment okay with you for this?

## 2020-06-05 NOTE — Telephone Encounter (Signed)
Yes virtual ok 

## 2020-06-06 ENCOUNTER — Ambulatory Visit (INDEPENDENT_AMBULATORY_CARE_PROVIDER_SITE_OTHER): Payer: BLUE CROSS/BLUE SHIELD | Admitting: Family Medicine

## 2020-06-06 NOTE — Telephone Encounter (Signed)
Angelica Young will contact patient and schedule visit.

## 2020-06-11 ENCOUNTER — Ambulatory Visit (INDEPENDENT_AMBULATORY_CARE_PROVIDER_SITE_OTHER): Payer: BLUE CROSS/BLUE SHIELD | Admitting: Family Medicine

## 2020-06-11 ENCOUNTER — Other Ambulatory Visit: Payer: Self-pay

## 2020-06-11 ENCOUNTER — Encounter (INDEPENDENT_AMBULATORY_CARE_PROVIDER_SITE_OTHER): Payer: Self-pay | Admitting: Family Medicine

## 2020-06-11 VITALS — BP 115/81 | HR 74 | Temp 98.2°F | Ht 62.0 in | Wt 179.0 lb

## 2020-06-11 DIAGNOSIS — N939 Abnormal uterine and vaginal bleeding, unspecified: Secondary | ICD-10-CM

## 2020-06-11 DIAGNOSIS — R7303 Prediabetes: Secondary | ICD-10-CM

## 2020-06-11 DIAGNOSIS — Z9189 Other specified personal risk factors, not elsewhere classified: Secondary | ICD-10-CM

## 2020-06-11 DIAGNOSIS — E669 Obesity, unspecified: Secondary | ICD-10-CM

## 2020-06-11 DIAGNOSIS — F3289 Other specified depressive episodes: Secondary | ICD-10-CM

## 2020-06-11 DIAGNOSIS — I10 Essential (primary) hypertension: Secondary | ICD-10-CM | POA: Diagnosis not present

## 2020-06-11 DIAGNOSIS — Z6832 Body mass index (BMI) 32.0-32.9, adult: Secondary | ICD-10-CM

## 2020-06-11 MED ORDER — METFORMIN HCL 500 MG PO TABS: 500.0000 mg | ORAL_TABLET | Freq: Two times a day (BID) | ORAL | 1 refills | Status: DC

## 2020-06-11 NOTE — Progress Notes (Signed)
Chief Complaint:   OBESITY Angelica Young is here to discuss her progress with her obesity treatment plan along with follow-up of her obesity related diagnoses. Angelica Young is on the Category 2 Plan and keeping a food journal and adhering to recommended goals of 1100-1200 calories and 85 grams of protein and states she is following her eating plan approximately 50% of the time. Angelica Young states she is walking for 30 minutes 4 times per week.  Today's visit was #: 7 Starting weight: 182 lbs Starting date: 12/21/2019 Today's weight: 179 lbs Today's date: 06/11/2020 Total lbs lost to date: 3 lbs Total lbs lost since last in-office visit: 0  Interim History: Angelica Young has endometriosis.  She reports bleeding for 2 months.  She has an upcoming hysteroscopy scheduled along with D& C.  She is taking Megace.  She is now journaling her food.  She has been having peanut butter crackers for snacks.  She endorses polyphagia.  She had a CPE with her PCP last month.  Assessment/Plan:   1. Essential hypertension Angelica Young is working on healthy weight loss and exercise to improve blood pressure control. We will watch for signs of hypotension as she continues her lifestyle modifications.  2. Abnormal uterine bleeding Angelica Young has endometriosis and has an upcoming GYN procedure scheduled.  3. Prediabetes Goal is HgbA1c < 5.7 and insulin level closer to 5. Angelica Young will continue to work on weight loss, exercise, and decreasing simple carbohydrates to help decrease the risk of diabetes.  Continue metformin.  -Refill metFORMIN (GLUCOPHAGE) 500 MG tablet; Take 1 tablet (500 mg total) by mouth 2 (two) times daily.  Dispense: 60 tablet; Refill: 1  4. Other depression with emotional eating Behavior modification techniques were discussed today to help Angelica Young deal with her emotional/non-hunger eating behaviors.  Orders and follow up as documented in patient record.   5. At risk for adverse drug reaction Angelica Young is at risk  for an adverse drug reaction due to polyphagia and taking Megace.  Angelica Young was given approximately 15 minutes of drug side effect counseling today.  We reviewed ways to add protein to meals, increase water intake, and use low-calorie but high-volume foods.   We did discuss a GLP1RA but the patient would like to hold for now.   6. Class 1 obesity with serious comorbidity and body mass index (BMI) of 32.0 to 32.9 in adult, unspecified obesity type Angelica Young is currently in the action stage of change. As such, her goal is to continue with weight loss efforts. She has agreed to keeping a food journal and adhering to recommended goals of 1200 calories and 95 grams of protein.   Exercise goals: For substantial health benefits, adults should do at least 150 minutes (2 hours and 30 minutes) a week of moderate-intensity, or 75 minutes (1 hour and 15 minutes) a week of vigorous-intensity aerobic physical activity, or an equivalent combination of moderate- and vigorous-intensity aerobic activity. Aerobic activity should be performed in episodes of at least 10 minutes, and preferably, it should be spread throughout the week.  Behavioral modification strategies: increasing lean protein intake, decreasing simple carbohydrates, increasing vegetables and increasing water intake.  Angelica Young has agreed to follow-up with our clinic in 3 weeks. She was informed of the importance of frequent follow-up visits to maximize her success with intensive lifestyle modifications for her multiple health conditions.   Objective:   Blood pressure 115/81, pulse 74, temperature 98.2 F (36.8 C), temperature source Oral, height 5\' 2"  (1.575 m), weight 179 lb (  81.2 kg), SpO2 98 %. Body mass index is 32.74 kg/m.  General: Cooperative, alert, well developed, in no acute distress. HEENT: Conjunctivae and lids unremarkable. Cardiovascular: Regular rhythm.  Lungs: Normal work of breathing. Neurologic: No focal deficits.   Lab Results    Component Value Date   CREATININE 0.6 11/29/2019   BUN 9 11/29/2019   NA 137 10/12/2019   K 4.0 11/29/2019   CL 101 11/29/2019   CO2 31 (A) 11/29/2019   Lab Results  Component Value Date   ALT 26 11/29/2019   AST 34 11/29/2019   ALKPHOS 56 11/29/2019   Lab Results  Component Value Date   HGBA1C 5.6 12/26/2019   Lab Results  Component Value Date   INSULIN 13.4 12/26/2019   Lab Results  Component Value Date   CHOL 159 11/29/2019   HDL 53 11/29/2019   LDLCALC 85 11/29/2019   TRIG 108 11/29/2019   Lab Results  Component Value Date   WBC 6.5 12/26/2019   HGB 12.3 12/26/2019   HCT 38.3 12/26/2019   MCV 93 12/26/2019   PLT 384 12/26/2019   Lab Results  Component Value Date   IRON 42 12/26/2019   TIBC 390 12/26/2019   FERRITIN 16 12/26/2019   Attestation Statements:   Reviewed by clinician on day of visit: allergies, medications, problem list, medical history, surgical history, family history, social history, and previous encounter notes.  I, Insurance claims handler, CMA, am acting as transcriptionist for Helane Rima, DO  I have reviewed the above documentation for accuracy and completeness, and I agree with the above. Helane Rima, DO

## 2020-06-14 ENCOUNTER — Telehealth (INDEPENDENT_AMBULATORY_CARE_PROVIDER_SITE_OTHER): Payer: BLUE CROSS/BLUE SHIELD | Admitting: Obstetrics and Gynecology

## 2020-06-14 ENCOUNTER — Encounter: Payer: Self-pay | Admitting: Obstetrics and Gynecology

## 2020-06-14 ENCOUNTER — Telehealth: Payer: Self-pay

## 2020-06-14 ENCOUNTER — Other Ambulatory Visit: Payer: Self-pay

## 2020-06-14 DIAGNOSIS — Z8742 Personal history of other diseases of the female genital tract: Secondary | ICD-10-CM

## 2020-06-14 DIAGNOSIS — N939 Abnormal uterine and vaginal bleeding, unspecified: Secondary | ICD-10-CM | POA: Diagnosis not present

## 2020-06-14 NOTE — Progress Notes (Signed)
Angelica Young 1981-03-30 [id number]  SUBJECTIVE:  39 y.o. G38P0020 female with history of stage 4 endometriosis presents via video encounter for discussion of management of abnormal uterine bleeding. Patient identity is confirmed by name and DOB. Still having daily bleeding on norethindrone 0.35 mg, so she went back to Megace 40 mg BID.   At her previous encounter we had discussed next steps in evaluation and management.  We reviewed pelvic ultrasound and endometrial biopsy, or hysteroscopy D&C for evaluation and treatment wise we discussed possibility of TXA, progestins, Mirena IUD, endometrial ablation. She apparently has had numerous previous D&Cs and some laparoscopies for treatment of her endometriosis in [place] by her fertility doctor.  Current Outpatient Medications  Medication Sig Dispense Refill  . FLUoxetine (PROZAC) 40 MG capsule Take 1 capsule (40 mg total) by mouth daily. 30 capsule 0  . megestrol (MEGACE) 40 MG tablet Take 40 mg by mouth 2 (two) times daily.    . metFORMIN (GLUCOPHAGE) 500 MG tablet Take 1 tablet (500 mg total) by mouth 2 (two) times daily. 60 tablet 1  . Multiple Vitamin (MULTIVITAMIN PO) Take 30 mLs by mouth daily. Source of life    . verapamil (CALAN-SR) 180 MG CR tablet Take 180 mg by mouth at bedtime.    . chlorthalidone (HYGROTON) 25 MG tablet Take 1 tablet (25 mg total) by mouth daily. (Patient not taking: Reported on 06/14/2020) 90 tablet 1  . Cyanocobalamin (VITAMIN B 12) 500 MCG TABS Take 1 tablet by mouth daily. (Patient not taking: Reported on 06/14/2020)    . Misc Natural Products (JOINT SUPPORT COMPLEX PO) Take 1 capsule by mouth daily. (Patient not taking: Reported on 06/14/2020)    . norethindrone (ORTHO MICRONOR) 0.35 MG tablet Take 1 tablet (0.35 mg total) by mouth daily. (Patient not taking: Reported on 06/14/2020) 90 tablet 4  . Vitamin D, Ergocalciferol, (DRISDOL) 1.25 MG (50000 UNIT) CAPS capsule Take 1 capsule (50,000 Units total) by mouth every  7 (seven) days. 4 capsule 0   No current facility-administered medications for this visit.   Allergies: Vicodin [hydrocodone-acetaminophen] and Sulfa antibiotics  No LMP recorded.  Past medical history,surgical history, problem list, medications, allergies, family history and social history were all reviewed and documented as reviewed in the EPIC chart.   OBJECTIVE:  There were no vitals taken for this visit. The patient appears well, alert, oriented, in no distress. Limited exam due to video encounter   ASSESSMENT:  39 y.o. G2P0020 presenting for discussion of abnormal uterine bleeding management  PLAN:  We reviewed the options again today and she would like to proceed with hysteroscopy D&C with Mirena IUD insertion. I discussed the same-day outpatient intent of the procedure, and risks of infection, bleeding, possible need for blood transfusion, perforation of uterus and/or cervix resulting in injury to surrounding organ structures including major pelvic blood vessels, bowel, bladder, and potentially ureter, and DVT.  Anesthesia will either be general or MAC depending on the anesthesiologist's choice, and inherent risks with being placed under anesthesia include myocardial infarction, stroke, rarely death.  Very rarely would laparoscopy and/or laparotomy be indicated to tend to any intra-abdominal hemorrhage and/or injury concern.   Postoperative recovery expectations of needing a few days away from employment duties in the absence of any complication were also discussed.  She offered to bring [place] records from her doctor in [place] which would be very helpful. We discussed some considerations with Mirena IUD insertion including the initial period of time after insertion to expect  cramping and/or abnormal bleeding, especially in the first 3-6 months use.  After this time period the period can get very light or some women become amenorrheic.  Insertion risks include infection, uterine  perforation and device migration into the abdomen which would require surgical removal, or the device can become dislodged causing pain or fall out of which would preclude effective contraception. We discussed hysterectomy could be a consideration of future, but if she has extensive endometriosis there may be a lot of intra-abdominal scarring and adhesion making this possibly difficult and high risk procedure. All questions are answered and the patient agrees to proceed.  I will send message to staff to work on arranging.    Joseph Pierini MD 06/14/20

## 2020-06-14 NOTE — Telephone Encounter (Signed)
I called patient to discuss scheduling surgery. We reviewed her ins benefits and her estimated surgery prepymt due to GGA by one week prior to her surgery.  I will send her a financial letter.  I scheduled her for 06/24/20 at 10:00am at [facility].  I advised her regarding need for Covid test pre op and appt was scheduled accordingly.  Instructions for quarantine after the test were reviewed with patient. Handout and map will be mailed to her.

## 2020-06-19 ENCOUNTER — Encounter (HOSPITAL_BASED_OUTPATIENT_CLINIC_OR_DEPARTMENT_OTHER): Payer: Self-pay | Admitting: Obstetrics and Gynecology

## 2020-06-19 ENCOUNTER — Other Ambulatory Visit: Payer: Self-pay

## 2020-06-19 MED ORDER — SUMATRIPTAN SUCCINATE 50 MG PO TABS: ORAL_TABLET | ORAL | 5 refills | Status: DC

## 2020-06-19 NOTE — Addendum Note (Signed)
Addended by: Bertram Savin on: 06/19/2020 05:19 PM   Modules accepted: Orders

## 2020-06-19 NOTE — Progress Notes (Addendum)
Spoke w/ via phone for pre-op interview---PT Lab needs dos----  I STAT 8, EKG ( CANNOT READ DATE ON EKG SCANNED INTO Epic/CHART ON  12-21-2019 ASK MDA IF NEEDS REPEATING ), URINE POCT           Lab results------NONE COVID test -----06-20-2020 840 AM- Arrive at -------800 AM 06-24-2020 NPO after MN NO Solid Food.  Clear liquids from MN until---700 AM THEN NPO Medications to take morning of surgery -----MEGACE FLUOXETINE Diabetic medication -----NONE DAY OF SURGERY Patient Special Instructions -----NONE Pre-Op special Istructions -----NONE Patient verbalized understanding of instructions that were given at this phone interview. Patient denies shortness of breath, chest pain, fever, cough at this phone interview.  PATIENT STATES SHE IS DIFFICULT IV ACCESS, STUCK 3 TIMES FOR 10-16-2019 SURGERY [facility] OR, CHART SENT TO JESSICA ZANETTO FOR REVIEW  SPOKE WITH JESSICA ZANETTO PA PT MEETS [facility] GUIDELINES ANESTHESIA TO ASSESS IN DAY OF SURGERY

## 2020-06-20 ENCOUNTER — Other Ambulatory Visit (HOSPITAL_COMMUNITY)
Admission: RE | Admit: 2020-06-20 | Discharge: 2020-06-20 | Disposition: A | Discharge status: Home / Self Care | Payer: BLUE CROSS/BLUE SHIELD | Source: Ambulatory Visit | Attending: Obstetrics and Gynecology | Admitting: Obstetrics and Gynecology

## 2020-06-20 ENCOUNTER — Other Ambulatory Visit (HOSPITAL_COMMUNITY): Payer: Self-pay

## 2020-06-20 DIAGNOSIS — Z01812 Encounter for preprocedural laboratory examination: Secondary | ICD-10-CM | POA: Insufficient documentation

## 2020-06-20 DIAGNOSIS — Z20822 Contact with and (suspected) exposure to covid-19: Secondary | ICD-10-CM | POA: Diagnosis not present

## 2020-06-20 LAB — SARS CORONAVIRUS 2 (TAT 6-24 HRS): SARS Coronavirus 2: NEGATIVE

## 2020-06-21 NOTE — Progress Notes (Signed)
Spoke with patient by phone and made aware of surgery time change arrive 530 am 06-24-2020 [facility], clear liquids from midnight until 430 am then npo, pt vocalized understanding all pre op instructions

## 2020-06-24 ENCOUNTER — Encounter (HOSPITAL_BASED_OUTPATIENT_CLINIC_OR_DEPARTMENT_OTHER): Payer: Self-pay | Admitting: Obstetrics and Gynecology

## 2020-06-24 ENCOUNTER — Ambulatory Visit (HOSPITAL_BASED_OUTPATIENT_CLINIC_OR_DEPARTMENT_OTHER): Payer: BLUE CROSS/BLUE SHIELD | Admitting: Physician Assistant

## 2020-06-24 ENCOUNTER — Encounter (HOSPITAL_BASED_OUTPATIENT_CLINIC_OR_DEPARTMENT_OTHER)
Admission: RE | Disposition: A | Discharge status: Home / Self Care | Payer: Self-pay | Source: Home / Self Care | Attending: Obstetrics and Gynecology

## 2020-06-24 ENCOUNTER — Ambulatory Visit (HOSPITAL_BASED_OUTPATIENT_CLINIC_OR_DEPARTMENT_OTHER)
Admission: RE | Admit: 2020-06-24 | Discharge: 2020-06-24 | Disposition: A | Discharge status: Home / Self Care | Payer: BLUE CROSS/BLUE SHIELD | Attending: Obstetrics and Gynecology | Admitting: Obstetrics and Gynecology

## 2020-06-24 ENCOUNTER — Encounter: Payer: Self-pay | Admitting: Gynecology

## 2020-06-24 DIAGNOSIS — F329 Major depressive disorder, single episode, unspecified: Secondary | ICD-10-CM | POA: Diagnosis not present

## 2020-06-24 DIAGNOSIS — N840 Polyp of corpus uteri: Secondary | ICD-10-CM | POA: Insufficient documentation

## 2020-06-24 DIAGNOSIS — Z79899 Other long term (current) drug therapy: Secondary | ICD-10-CM | POA: Insufficient documentation

## 2020-06-24 DIAGNOSIS — N920 Excessive and frequent menstruation with regular cycle: Secondary | ICD-10-CM | POA: Insufficient documentation

## 2020-06-24 DIAGNOSIS — Z30430 Encounter for insertion of intrauterine contraceptive device: Secondary | ICD-10-CM | POA: Diagnosis not present

## 2020-06-24 DIAGNOSIS — N921 Excessive and frequent menstruation with irregular cycle: Secondary | ICD-10-CM | POA: Diagnosis not present

## 2020-06-24 DIAGNOSIS — G43709 Chronic migraine without aura, not intractable, without status migrainosus: Secondary | ICD-10-CM | POA: Diagnosis not present

## 2020-06-24 DIAGNOSIS — G4730 Sleep apnea, unspecified: Secondary | ICD-10-CM | POA: Insufficient documentation

## 2020-06-24 DIAGNOSIS — I10 Essential (primary) hypertension: Secondary | ICD-10-CM | POA: Insufficient documentation

## 2020-06-24 DIAGNOSIS — Z7984 Long term (current) use of oral hypoglycemic drugs: Secondary | ICD-10-CM | POA: Insufficient documentation

## 2020-06-24 DIAGNOSIS — N939 Abnormal uterine and vaginal bleeding, unspecified: Secondary | ICD-10-CM | POA: Diagnosis not present

## 2020-06-24 DIAGNOSIS — R7303 Prediabetes: Secondary | ICD-10-CM | POA: Diagnosis not present

## 2020-06-24 HISTORY — DX: Other specified health status: Z78.9

## 2020-06-24 HISTORY — DX: Abnormal uterine and vaginal bleeding, unspecified: N93.9

## 2020-06-24 HISTORY — DX: Nausea with vomiting, unspecified: R11.2

## 2020-06-24 HISTORY — DX: Disorder of the skin and subcutaneous tissue, unspecified: L98.9

## 2020-06-24 HISTORY — PX: INTRAUTERINE DEVICE (IUD) INSERTION: SHX5877

## 2020-06-24 HISTORY — PX: HYSTEROSCOPY WITH D & C: SHX1775

## 2020-06-24 HISTORY — DX: Other complications of anesthesia, initial encounter: T88.59XA

## 2020-06-24 HISTORY — DX: Nausea with vomiting, unspecified: Z98.890

## 2020-06-24 LAB — POCT PREGNANCY, URINE: Preg Test, Ur: NEGATIVE

## 2020-06-24 LAB — ABO/RH: ABO/RH(D): O POS

## 2020-06-24 LAB — TYPE AND SCREEN
ABO/RH(D): O POS
Antibody Screen: NEGATIVE

## 2020-06-24 SURGERY — DILATATION AND CURETTAGE /HYSTEROSCOPY
Anesthesia: General | Site: Vagina

## 2020-06-24 MED ORDER — KETOROLAC TROMETHAMINE 30 MG/ML IJ SOLN
INTRAMUSCULAR | Status: DC | PRN
  Administered 2020-06-24: 30 mg via INTRAVENOUS

## 2020-06-24 MED ORDER — LIDOCAINE 2% (20 MG/ML) 5 ML SYRINGE
INTRAMUSCULAR | Status: AC
  Filled 2020-06-24: qty 5

## 2020-06-24 MED ORDER — SCOPOLAMINE 1 MG/3DAYS TD PT72
MEDICATED_PATCH | TRANSDERMAL | Status: AC
  Filled 2020-06-24: qty 1

## 2020-06-24 MED ORDER — LEVONORGESTREL 20 MCG/24HR IU IUD: INTRAUTERINE_SYSTEM | INTRAUTERINE | Status: AC

## 2020-06-24 MED ORDER — LIDOCAINE 2% (20 MG/ML) 5 ML SYRINGE
INTRAMUSCULAR | Status: DC | PRN
  Administered 2020-06-24: 80 mg via INTRAVENOUS

## 2020-06-24 MED ORDER — IBUPROFEN 100 MG/5ML PO SUSP: 200.0000 mg | Freq: Four times a day (QID) | ORAL | Status: DC | PRN

## 2020-06-24 MED ORDER — DEXAMETHASONE SODIUM PHOSPHATE 10 MG/ML IJ SOLN
INTRAMUSCULAR | Status: AC
  Filled 2020-06-24: qty 1

## 2020-06-24 MED ORDER — IBUPROFEN 600 MG PO TABS: 600.0000 mg | ORAL_TABLET | Freq: Four times a day (QID) | ORAL | 1 refills | Status: AC

## 2020-06-24 MED ORDER — LACTATED RINGERS IV SOLN: INTRAVENOUS | Status: DC

## 2020-06-24 MED ORDER — ONDANSETRON HCL 4 MG/2ML IJ SOLN: 4.0000 mg | Freq: Once | INTRAMUSCULAR | Status: DC | PRN

## 2020-06-24 MED ORDER — ONDANSETRON HCL 4 MG/2ML IJ SOLN: 4.0000 mg | Freq: Four times a day (QID) | INTRAMUSCULAR | Status: DC | PRN

## 2020-06-24 MED ORDER — MIDAZOLAM HCL 2 MG/2ML IJ SOLN
INTRAMUSCULAR | Status: AC
  Filled 2020-06-24: qty 2

## 2020-06-24 MED ORDER — ACETAMINOPHEN 500 MG PO TABS: 1000.0000 mg | ORAL_TABLET | Freq: Four times a day (QID) | ORAL | Status: DC | PRN

## 2020-06-24 MED ORDER — MIDAZOLAM HCL 5 MG/5ML IJ SOLN
INTRAMUSCULAR | Status: DC | PRN
  Administered 2020-06-24: 2 mg via INTRAVENOUS

## 2020-06-24 MED ORDER — ONDANSETRON HCL 4 MG/2ML IJ SOLN
INTRAMUSCULAR | Status: AC
  Filled 2020-06-24: qty 2

## 2020-06-24 MED ORDER — SODIUM CHLORIDE 0.9% FLUSH: 3.0000 mL | Freq: Two times a day (BID) | INTRAVENOUS | Status: DC

## 2020-06-24 MED ORDER — FENTANYL CITRATE (PF) 100 MCG/2ML IJ SOLN
INTRAMUSCULAR | Status: DC | PRN
Start: 2020-06-24 — End: 2020-06-24
  Administered 2020-06-24: 50 ug via INTRAVENOUS
  Administered 2020-06-24 (×2): 25 ug via INTRAVENOUS

## 2020-06-24 MED ORDER — IBUPROFEN 200 MG PO TABS: 200.0000 mg | ORAL_TABLET | Freq: Four times a day (QID) | ORAL | Status: DC | PRN

## 2020-06-24 MED ORDER — FENTANYL CITRATE (PF) 100 MCG/2ML IJ SOLN: 25.0000 ug | INTRAMUSCULAR | Status: DC | PRN

## 2020-06-24 MED ORDER — PROPOFOL 10 MG/ML IV BOLUS
INTRAVENOUS | Status: AC
  Filled 2020-06-24: qty 20

## 2020-06-24 MED ORDER — BUPIVACAINE HCL (PF) 0.25 % IJ SOLN: 10.0000 mL | Freq: Once | INTRAMUSCULAR | Status: DC

## 2020-06-24 MED ORDER — BUPIVACAINE HCL (PF) 0.25 % IJ SOLN
INTRAMUSCULAR | Status: DC | PRN
  Administered 2020-06-24: 10 mL

## 2020-06-24 MED ORDER — OXYCODONE HCL 5 MG PO TABS: 5.0000 mg | ORAL_TABLET | ORAL | Status: DC | PRN

## 2020-06-24 MED ORDER — ONDANSETRON HCL 4 MG/2ML IJ SOLN
INTRAMUSCULAR | Status: DC | PRN
  Administered 2020-06-24: 4 mg via INTRAVENOUS

## 2020-06-24 MED ORDER — SODIUM CHLORIDE 0.9% FLUSH: 3.0000 mL | INTRAVENOUS | Status: DC | PRN

## 2020-06-24 MED ORDER — MEPERIDINE HCL 25 MG/ML IJ SOLN: 6.2500 mg | INTRAMUSCULAR | Status: DC | PRN

## 2020-06-24 MED ORDER — SODIUM CHLORIDE 0.9 % IV SOLN: 250.0000 mL | INTRAVENOUS | Status: DC | PRN

## 2020-06-24 MED ORDER — SCOPOLAMINE 1 MG/3DAYS TD PT72
1.0000 | MEDICATED_PATCH | TRANSDERMAL | Status: DC
  Administered 2020-06-24: 1.5 mg via TRANSDERMAL

## 2020-06-24 MED ORDER — DEXAMETHASONE SODIUM PHOSPHATE 10 MG/ML IJ SOLN
INTRAMUSCULAR | Status: DC | PRN
  Administered 2020-06-24: 10 mg via INTRAVENOUS

## 2020-06-24 MED ORDER — KETOROLAC TROMETHAMINE 30 MG/ML IJ SOLN
INTRAMUSCULAR | Status: AC
  Filled 2020-06-24: qty 1

## 2020-06-24 MED ORDER — POVIDONE-IODINE 10 % EX SWAB: 2.0000 "application " | Freq: Once | CUTANEOUS | Status: DC

## 2020-06-24 MED ORDER — FENTANYL CITRATE (PF) 100 MCG/2ML IJ SOLN
INTRAMUSCULAR | Status: AC
Start: 2020-06-24 — End: ?
  Filled 2020-06-24: qty 2

## 2020-06-24 MED ORDER — ACETAMINOPHEN 500 MG PO TABS: 1000.0000 mg | ORAL_TABLET | Freq: Four times a day (QID) | ORAL | Status: DC

## 2020-06-24 MED ORDER — PROPOFOL 10 MG/ML IV BOLUS
INTRAVENOUS | Status: DC | PRN
  Administered 2020-06-24: 160 mg via INTRAVENOUS

## 2020-06-24 MED ORDER — SODIUM CHLORIDE 0.9 % IR SOLN
Status: DC | PRN
  Administered 2020-06-24: 1410 mL

## 2020-06-24 MED ORDER — LEVONORGESTREL 20 MCG/24HR IU IUD
INTRAUTERINE_SYSTEM | INTRAUTERINE | Status: AC
  Filled 2020-06-24: qty 1

## 2020-06-24 SURGICAL SUPPLY — 26 items
BIPOLAR CUTTING LOOP 21FR (ELECTRODE)
CATH ROBINSON RED A/P 16FR (CATHETERS) ×3 IMPLANT
COVER WAND RF STERILE (DRAPES) ×3 IMPLANT
DILATOR CANAL MILEX (MISCELLANEOUS) IMPLANT
DRAPE HYSTEROSCOPY (DRAPE) ×1 IMPLANT
ELECT REM PT RETURN 9FT ADLT (ELECTROSURGICAL)
ELECTRODE REM PT RTRN 9FT ADLT (ELECTROSURGICAL) IMPLANT
GAUZE 4X4 16PLY RFD (DISPOSABLE) ×3 IMPLANT
GLOVE BIO SURGEON STRL SZ 6.5 (GLOVE) ×1 IMPLANT
GLOVE BIO SURGEON STRL SZ8 (GLOVE) ×3 IMPLANT
GLOVE BIOGEL PI IND STRL 7.0 (GLOVE) IMPLANT
GLOVE BIOGEL PI IND STRL 8 (GLOVE) ×2 IMPLANT
GLOVE BIOGEL PI INDICATOR 7.0 (GLOVE) ×1
GLOVE BIOGEL PI INDICATOR 8 (GLOVE) ×1
GOWN STRL REUS W/TWL LRG LVL3 (GOWN DISPOSABLE) ×1 IMPLANT
GOWN STRL REUS W/TWL XL LVL3 (GOWN DISPOSABLE) ×3 IMPLANT
IV NS IRRIG 3000ML ARTHROMATIC (IV SOLUTION) ×3 IMPLANT
KIT PROCEDURE FLUENT (KITS) ×3 IMPLANT
KIT TURNOVER CYSTO (KITS) ×3 IMPLANT
LEGGING LITHOTOMY PAIR STRL (DRAPES) ×1 IMPLANT
LOOP CUTTING BIPOLAR 21FR (ELECTRODE) IMPLANT
MIRENA ×1 IMPLANT
PACK VAGINAL MINOR WOMEN LF (CUSTOM PROCEDURE TRAY) ×3 IMPLANT
PAD OB MATERNITY 4.3X12.25 (PERSONAL CARE ITEMS) ×3 IMPLANT
TOWEL OR 17X26 10 PK STRL BLUE (TOWEL DISPOSABLE) ×3 IMPLANT
WATER STERILE IRR 500ML POUR (IV SOLUTION) ×2 IMPLANT

## 2020-06-24 NOTE — Op Note (Signed)
Name: Angelica Young  Age: 39 y.o.  Date of Birth: [birth year] Medical Record #: [id number]   Operative Note   Preoperative Diagnosis: Abnormal uterine bleeding/menorrhagia Procedure: Hysteroscopy, Dilatation and Curettage, Polypectomy, Mirena IUD placement Postoperative Diagnosis: same, and endometrial polyps Surgeon: Theresia Majors, MD Estimated Blood Loss: 10 mL Anesthesia: General LMA, local with 0.25% bupivacaine (10 mL) Specimens: Endometrial curettings, endometrial polyp Findings: Normal endometrial cavity with bilateral fallopian tube ostia visualized.  Normal cavity contour without submucosal fibroids.  Polypoid tissue present on left lateral aspect of lower uterine segment and a small polyp <1 cm in size present overlying right tubal ostium.  Endocervical canal normal. Uterus sounds to 9 cm.  Hysteroscopic fluid deficit 90 mL.  Complication: none. Date: 06/24/20     DESCRIPTION OF PROCEDURE:      Preoperative review of the procedure was completed with the patient prior to transport to the operating room including risks, benefits, and alternatives.  The patient's questions were answered and she agreed to proceed.    Under anesthesia, Angelica Young was placed in the dorsolithotomy position with legs in yellofin stirrups with SCDs applied and running.  A surgical team time out was performed to verify and agree on procedure and patient consent. A bimanual exam was performed.  The patient was prepped and draped in the usual sterile fashion.      Cervix was visualized with a weighted speculum placed in the posterior vagina and a Sims retractor anteriorly.  A paracervical block was applied in the standard fashion using 0.25% bupivacaine. The anterior cervix was grasped with a single-toothed tenaculum. The uterine descensus was noted to be moderate.  Cervix was gently dilated using a progressive series of Pratt dilators with ease.  There was no concern for uterine or cervical perforation.   The uterine cavity was very gently sounded to establish a measurement of uterine cavity depth.  The Myosure hysteroscope was introduced and the uterine cavity and ostia were visualized.  Findings were described as above. The cervix was gently dilated a second time to size 19 to allow introduction of a small curette.  A thorough curettage was productive of a moderate quantity of endometrial curettings which were sent to pathology for analysis. The curettage was effective at achieving a uterine cry circumferentially.  The hysteroscope was introduced into the endometrial cavity again.  The polypoid tissue present on left lateral aspect of lower uterine segment had been removed with the sharp curettage.  The small polyp present at the right tubal ostium remained.  Hysteroscopic shears were introduced into the uterine cavity to transect the small polyp from its base near the right ostium and then the hysteroscopic grasper was inserted and the polyp grasped and removed with gentle traction.  The polyp was sent as a separate specimen to pathology.  Bleeding was minimal at that point.    The Mirena IUD was then prepared and inserted according to manufacturer's instructions without any concern for uterine perforation.  The strings were trimmed to a length of 3 cm.  The tenaculum was removed from the cervix and the puncture sites were hemostatic after application of pressure and silver nitrate cautery.   Sponge and instrument counts were correct at the conclusion of the procedure.  The patient tolerated the procedure well and was brought to the recovery room in stable condition.      Theresia Majors, M.D., [facility]

## 2020-06-24 NOTE — Anesthesia Postprocedure Evaluation (Signed)
Anesthesia Post Note  Patient: Angelica Young  Procedure(s) Performed: DILATATION AND CURETTAGE /HYSTEROSCOPY; POLYPECTOMY (N/A Vagina ) INTRAUTERINE DEVICE (IUD) INSERTION WITH MIRENA (N/A Uterus)     Patient location during evaluation: PACU Anesthesia Type: General Level of consciousness: awake and alert Pain management: pain level controlled Vital Signs Assessment: post-procedure vital signs reviewed and stable Respiratory status: spontaneous breathing, nonlabored ventilation, respiratory function stable and patient connected to nasal cannula oxygen Cardiovascular status: blood pressure returned to baseline and stable Postop Assessment: no apparent nausea or vomiting Anesthetic complications: no   No complications documented.  Last Vitals:  Vitals:   06/24/20 0845 06/24/20 0900  BP: 115/82 115/81  Pulse: 79 79  Resp: 11 12  Temp:    SpO2: 98% 93%    Last Pain:  Vitals:   06/24/20 0900  TempSrc:   PainSc: 0-No pain                 Earl Lites P Lekesha Claw

## 2020-06-24 NOTE — Anesthesia Procedure Notes (Signed)
Procedure Name: LMA Insertion Date/Time: 06/24/2020 7:39 AM Performed by: Myrakle Wingler D, CRNA Pre-anesthesia Checklist: Patient identified, Emergency Drugs available, Suction available and Patient being monitored Patient Re-evaluated:Patient Re-evaluated prior to induction Oxygen Delivery Method: Circle system utilized Preoxygenation: Pre-oxygenation with 100% oxygen Induction Type: IV induction Ventilation: Mask ventilation without difficulty LMA Size: 4.0 Tube type: Oral Number of attempts: 1 Placement Confirmation: positive ETCO2 and breath sounds checked- equal and bilateral Tube secured with: Tape Dental Injury: Teeth and Oropharynx as per pre-operative assessment

## 2020-06-24 NOTE — H&P (Signed)
Angelica Young 1981-03-24 MRN: [id number]  HPI The patient is a 39 y.o. G2P0020 with history stage 4 endometriosis who presents today for scheduled hysteroscopy D&C and Mirena IUD placement for abnormal uterine bleeding/menorrhagia.  No changes to her medical history since her pre op exam, denies CP, SOB, fever/chills, dysuria.  Past Medical History:  Diagnosis Date  . Abnormal uterine bleeding (AUB)    LAST 7 WEEKS  . Complication of anesthesia    SLOW TO AWAKEN AT TIMES  . Difficult intravenous access    LAST 6 OF 7 SURGERIES, STUCK X 3 ON 10-16-2019 SURGERY  . Endometriosis 2014  . Hypertension   . Infertility, female   . Migraines   . PONV (postoperative nausea and vomiting)    NAUSEA MOSTLY VOMITING X 2 SURGERIES  . Pre-diabetes   . Skin abnormality    HIZRADENITIS SUPRIVITA  . Sleep apnea    MILD NO CPAP USED IN  4 YEARS  . Vitamin D deficiency    Past Surgical History:  Procedure Laterality Date  . BREAST SURGERY     reduction 2009  . DILATION AND CURETTAGE OF UTERUS    . HERNIA REPAIR  2011   UMBILICAL  . LAPAROSCOPIC ASSISTED VENTRAL HERNIA REPAIR N/A 10/16/2019   Procedure: LAPAROSCOPIC ASSISTED VENTRAL HERNIA REPAIR WITH MESH;  Surgeon: Luretha Murphy, MD;  Location: [facility]S;  Service: General;  Laterality: N/A;  . NM MISC PROCEDURE  [id number]   miscarried   . PELVIC LAPAROSCOPY     x 4 for endometriosis   No current facility-administered medications on file prior to encounter.   Current Outpatient Medications on File Prior to Encounter  Medication Sig Dispense Refill  . chlorthalidone (HYGROTON) 25 MG tablet Take 1 tablet (25 mg total) by mouth daily. 90 tablet 1  . doxycycline (ADOXA) 100 MG tablet Take 100 mg by mouth 2 (two) times daily.    Marland Kitchen FLUoxetine (PROZAC) 40 MG capsule Take 1 capsule (40 mg total) by mouth daily. 30 capsule 0  . megestrol (MEGACE) 40 MG tablet Take 40 mg by mouth 2 (two) times daily.    . metFORMIN (GLUCOPHAGE) 500 MG tablet Take 1  tablet (500 mg total) by mouth 2 (two) times daily. 60 tablet 1  . verapamil (CALAN-SR) 180 MG CR tablet Take 180 mg by mouth at bedtime.    Marland Kitchen doxycycline (ADOXA) 50 MG tablet Take 50 mg by mouth every evening. STARTS 06-20-2020    . Multiple Vitamin (MULTIVITAMIN PO) Take 30 mLs by mouth daily. Source of life    . norethindrone (ORTHO MICRONOR) 0.35 MG tablet Take 1 tablet (0.35 mg total) by mouth daily. (Patient not taking: Reported on 06/14/2020) 90 tablet 4    Allergies  Allergen Reactions  . Vicodin [Hydrocodone-Acetaminophen] Nausea And Vomiting  . Sulfa Antibiotics Rash      Physical Exam   BP (!) (P) 127/101   Pulse (P) 78   Temp (P) 99.3 F (37.4 C) (Oral)   Resp (P) 16   Ht 5\' 2"  (1.575 m)   Wt 83.1 kg   LMP 04/18/2020   SpO2 (P) 100%   BMI 33.51 kg/m    General: Pleasant female, no acute distress, alert and oriented CV: RRR, no murmurs Pulm: good respiratory effort, CTAB     Plan Proceed with hysteroscopy D&C and Mirena IUD placement as planned.  Post operative recovery reviewed.  All questions answered and the patient agrees to proceed.   06/19/2020, MD 06/24/20

## 2020-06-24 NOTE — Transfer of Care (Signed)
Immediate Anesthesia Transfer of Care Note  Patient: Angelica Young  Procedure(s) Performed: DILATATION AND CURETTAGE /HYSTEROSCOPY; POLYPECTOMY (N/A Vagina ) INTRAUTERINE DEVICE (IUD) INSERTION WITH MIRENA (N/A Uterus)  Patient Location: PACU  Anesthesia Type:General  Level of Consciousness: awake, alert  and oriented  Airway & Oxygen Therapy: Patient Spontanous Breathing and Patient connected to nasal cannula oxygen  Post-op Assessment: Report given to RN and Post -op Vital signs reviewed and stable  Post vital signs: Reviewed and stable  Last Vitals:  Vitals Value Taken Time  BP 122/80 06/24/20 0822  Temp    Pulse 91 06/24/20 0824  Resp 13 06/24/20 0824  SpO2 98 % 06/24/20 0824  Vitals shown include unvalidated device data.  Last Pain:  Vitals:   06/24/20 0606  TempSrc: (P) Oral  PainSc: 0-No pain      Patients Stated Pain Goal: 3 (06/24/20 0606)  Complications: No complications documented.

## 2020-06-24 NOTE — Discharge Instructions (Signed)
General Anesthesia, Adult, Care After This sheet gives you information about how to care for yourself after your procedure. Your health care provider may also give you more specific instructions. If you have problems or questions, contact your health care provider. What can I expect after the procedure? After the procedure, the following side effects are common:  Pain or discomfort at the IV site.  Nausea.  Vomiting.  Sore throat.  Trouble concentrating.  Feeling cold or chills.  Weak or tired.  Sleepiness and fatigue.  Soreness and body aches. These side effects can affect parts of the body that were not involved in surgery. Follow these instructions at home:  For at least 24 hours after the procedure:  Have a responsible adult stay with you. It is important to have someone help care for you until you are awake and alert.  Rest as needed.  Do not: ? Participate in activities in which you could fall or become injured. ? Drive. ? Use heavy machinery. ? Drink alcohol. ? Take sleeping pills or medicines that cause drowsiness. ? Make important decisions or sign legal documents. ? Take care of children on your own. Eating and drinking  Follow any instructions from your health care provider about eating or drinking restrictions.  When you feel hungry, start by eating small amounts of foods that are soft and easy to digest (bland), such as toast. Gradually return to your regular diet.  Drink enough fluid to keep your urine pale yellow.  If you vomit, rehydrate by drinking water, juice, or clear broth. General instructions  If you have sleep apnea, surgery and certain medicines can increase your risk for breathing problems. Follow instructions from your health care provider about wearing your sleep device: ? Anytime you are sleeping, including during daytime naps. ? While taking prescription pain medicines, sleeping medicines, or medicines that make you drowsy.  Return to  your normal activities as told by your health care provider. Ask your health care provider what activities are safe for you.  Take over-the-counter and prescription medicines only as told by your health care provider.  If you smoke, do not smoke without supervision.  Keep all follow-up visits as told by your health care provider. This is important. Contact a health care provider if:  You have nausea or vomiting that does not get better with medicine.  You cannot eat or drink without vomiting.  You have pain that does not get better with medicine.  You are unable to pass urine.  You develop a skin rash.  You have a fever.  You have redness around your IV site that gets worse. Get help right away if:  You have difficulty breathing.  You have chest pain.  You have blood in your urine or stool, or you vomit blood. Summary  After the procedure, it is common to have a sore throat or nausea. It is also common to feel tired.  Have a responsible adult stay with you for the first 24 hours after general anesthesia. It is important to have someone help care for you until you are awake and alert.  When you feel hungry, start by eating small amounts of foods that are soft and easy to digest (bland), such as toast. Gradually return to your regular diet.  Drink enough fluid to keep your urine pale yellow.  Return to your normal activities as told by your health care provider. Ask your health care provider what activities are safe for you. This information is not   intended to replace advice given to you by your health care provider. Make sure you discuss any questions you have with your health care provider. Document Revised: 10/01/2017 Document Reviewed: 05/14/2017 Elsevier Patient Education  2020 [employer]  

## 2020-06-24 NOTE — Anesthesia Preprocedure Evaluation (Signed)
Anesthesia Evaluation  Patient identified by MRN, date of birth, ID band Patient awake    Reviewed: Patient's Chart, lab work & pertinent test results  History of Anesthesia Complications (+) PONV and history of anesthetic complications  Airway Mallampati: II  TM Distance: >3 FB Neck ROM: Full    Dental  (+) Teeth Intact   Pulmonary sleep apnea and Continuous Positive Airway Pressure Ventilation ,    Pulmonary exam normal        Cardiovascular hypertension, negative cardio ROS   Rhythm:Regular Rate:Normal     Neuro/Psych  Headaches, Depression    GI/Hepatic negative GI ROS, Neg liver ROS,   Endo/Other  negative endocrine ROS  Renal/GU negative Renal ROS  Female GU complaint     Musculoskeletal negative musculoskeletal ROS (+)   Abdominal Normal abdominal exam  (+)   Peds negative pediatric ROS (+)  Hematology negative hematology ROS (+)   Anesthesia Other Findings   Reproductive/Obstetrics negative OB ROS                             Anesthesia Physical Anesthesia Plan  ASA: II  Anesthesia Plan: General   Post-op Pain Management:    Induction: Intravenous  PONV Risk Score and Plan: 4 or greater and Ondansetron, Dexamethasone and Scopolamine patch - Pre-op  Airway Management Planned: LMA and Mask  Additional Equipment: None  Intra-op Plan:   Post-operative Plan:   Informed Consent: I have reviewed the patients History and Physical, chart, labs and discussed the procedure including the risks, benefits and alternatives for the proposed anesthesia with the patient or authorized representative who has indicated his/her understanding and acceptance.       Plan Discussed with: CRNA and Surgeon  Anesthesia Plan Comments: (Lab Results      Component                Value               Date                      WBC                      6.5                 12/26/2019                 HGB                      12.3                12/26/2019                HCT                      38.3                12/26/2019                MCV                      93                  12/26/2019                PLT  384                 12/26/2019           Covid-19 Nucleic Acid Test Results Lab Results      Component                Value               Date                      SARSCOV2NAA              NEGATIVE            06/20/2020                SARSCOV2NAA              NOT DETECTED        10/12/2019           Lab Results      Component                Value               Date                      PREGTESTUR               NEGATIVE            06/24/2020          )        Anesthesia Quick Evaluation

## 2020-06-25 ENCOUNTER — Encounter (HOSPITAL_BASED_OUTPATIENT_CLINIC_OR_DEPARTMENT_OTHER): Payer: Self-pay | Admitting: Obstetrics and Gynecology

## 2020-06-25 ENCOUNTER — Other Ambulatory Visit: Payer: Self-pay | Admitting: Neurology

## 2020-06-25 LAB — SURGICAL PATHOLOGY

## 2020-06-25 MED ORDER — SUMATRIPTAN SUCCINATE 100 MG PO TABS: 100.0000 mg | ORAL_TABLET | Freq: Once | ORAL | 12 refills | Status: DC | PRN

## 2020-06-27 ENCOUNTER — Other Ambulatory Visit: Payer: Self-pay | Admitting: Obstetrics and Gynecology

## 2020-07-02 ENCOUNTER — Encounter (INDEPENDENT_AMBULATORY_CARE_PROVIDER_SITE_OTHER): Payer: Self-pay | Admitting: Family Medicine

## 2020-07-02 ENCOUNTER — Other Ambulatory Visit: Payer: Self-pay

## 2020-07-02 ENCOUNTER — Ambulatory Visit (INDEPENDENT_AMBULATORY_CARE_PROVIDER_SITE_OTHER): Payer: BLUE CROSS/BLUE SHIELD | Admitting: Family Medicine

## 2020-07-02 VITALS — BP 120/87 | HR 75 | Temp 98.5°F | Ht 62.0 in | Wt 180.0 lb

## 2020-07-02 DIAGNOSIS — Z9189 Other specified personal risk factors, not elsewhere classified: Secondary | ICD-10-CM

## 2020-07-02 DIAGNOSIS — N809 Endometriosis, unspecified: Secondary | ICD-10-CM | POA: Diagnosis not present

## 2020-07-02 DIAGNOSIS — R632 Polyphagia: Secondary | ICD-10-CM | POA: Diagnosis not present

## 2020-07-02 DIAGNOSIS — R7303 Prediabetes: Secondary | ICD-10-CM | POA: Diagnosis not present

## 2020-07-02 DIAGNOSIS — Z6833 Body mass index (BMI) 33.0-33.9, adult: Secondary | ICD-10-CM

## 2020-07-02 DIAGNOSIS — E559 Vitamin D deficiency, unspecified: Secondary | ICD-10-CM

## 2020-07-02 DIAGNOSIS — I10 Essential (primary) hypertension: Secondary | ICD-10-CM | POA: Diagnosis not present

## 2020-07-02 DIAGNOSIS — Z9889 Other specified postprocedural states: Secondary | ICD-10-CM

## 2020-07-02 DIAGNOSIS — E669 Obesity, unspecified: Secondary | ICD-10-CM

## 2020-07-03 ENCOUNTER — Other Ambulatory Visit (INDEPENDENT_AMBULATORY_CARE_PROVIDER_SITE_OTHER): Payer: Self-pay | Admitting: Family Medicine

## 2020-07-03 DIAGNOSIS — R7303 Prediabetes: Secondary | ICD-10-CM

## 2020-07-03 LAB — COMPREHENSIVE METABOLIC PANEL
ALT: 56 IU/L — ABNORMAL HIGH (ref 0–32)
AST: 34 IU/L (ref 0–40)
Albumin/Globulin Ratio: 1.5 (ref 1.2–2.2)
Albumin: 4.3 g/dL (ref 3.8–4.8)
Alkaline Phosphatase: 60 IU/L (ref 44–121)
BUN/Creatinine Ratio: 14 (ref 9–23)
BUN: 9 mg/dL (ref 6–24)
Bilirubin Total: <0.2 mg/dL (ref 0.0–1.2)
CO2: 24 mmol/L (ref 20–29)
Calcium: 9.1 mg/dL (ref 8.7–10.2)
Chloride: 105 mmol/L (ref 96–106)
Creatinine, Ser: 0.65 mg/dL (ref 0.57–1.00)
GFR calc Af Amer: 128 mL/min/{1.73_m2} (ref >59)
GFR calc non Af Amer: 111 mL/min/{1.73_m2} (ref >59)
Globulin, Total: 2.8 g/dL (ref 1.5–4.5)
Glucose: 96 mg/dL (ref 65–99)
Potassium: 3.8 mmol/L (ref 3.5–5.2)
Sodium: 143 mmol/L (ref 134–144)
Total Protein: 7.1 g/dL (ref 6.0–8.5)

## 2020-07-03 LAB — CBC WITH DIFFERENTIAL/PLATELET
Basophils Absolute: 0.1 10*3/uL (ref 0.0–0.2)
Basos: 1 %
EOS (ABSOLUTE): 0.1 10*3/uL (ref 0.0–0.4)
Eos: 2 %
Hemoglobin: 13 g/dL (ref 11.1–15.9)
Immature Grans (Abs): 0 10*3/uL (ref 0.0–0.1)
Immature Granulocytes: 0 %
Lymphocytes Absolute: 2.4 10*3/uL (ref 0.7–3.1)
Lymphs: 34 %
MCH: 30.5 pg (ref 26.6–33.0)
MCHC: 32.8 g/dL (ref 31.5–35.7)
MCV: 93 fL (ref 79–97)
Monocytes Absolute: 0.6 10*3/uL (ref 0.1–0.9)
Monocytes: 8 %
Neutrophils Absolute: 3.9 10*3/uL (ref 1.4–7.0)
Neutrophils: 55 %
Platelets: 324 10*3/uL (ref 150–450)
RBC: 4.26 x10E6/uL (ref 3.77–5.28)
RDW: 14.2 % (ref 11.7–15.4)
WBC: 7.1 10*3/uL (ref 3.4–10.8)

## 2020-07-03 LAB — ANEMIA PANEL
Ferritin: 22 ng/mL (ref 15–150)
Folate, Hemolysate: 503 ng/mL
Folate, RBC: 1270 ng/mL (ref >498)
Hematocrit: 39.6 % (ref 34.0–46.6)
Iron Saturation: 21 % (ref 15–55)
Iron: 77 ug/dL (ref 27–159)
Retic Ct Pct: 1.8 % (ref 0.6–2.6)
Total Iron Binding Capacity: 368 ug/dL (ref 250–450)
UIBC: 291 ug/dL (ref 131–425)
Vitamin B-12: 1174 pg/mL (ref 232–1245)

## 2020-07-03 LAB — LIPID PANEL
Chol/HDL Ratio: 3.6 ratio (ref 0.0–4.4)
Cholesterol, Total: 174 mg/dL (ref 100–199)
HDL: 48 mg/dL (ref >39)
LDL Chol Calc (NIH): 116 mg/dL — ABNORMAL HIGH (ref 0–99)
Triglycerides: 49 mg/dL (ref 0–149)
VLDL Cholesterol Cal: 10 mg/dL (ref 5–40)

## 2020-07-03 LAB — VITAMIN D 25 HYDROXY (VIT D DEFICIENCY, FRACTURES): Vit D, 25-Hydroxy: 54.3 ng/mL (ref 30.0–100.0)

## 2020-07-03 LAB — HEMOGLOBIN A1C
Est. average glucose Bld gHb Est-mCnc: 111 mg/dL
Hgb A1c MFr Bld: 5.5 % (ref 4.8–5.6)

## 2020-07-03 NOTE — Progress Notes (Signed)
Chief Complaint:   OBESITY Angelica Young is here to discuss her progress with her obesity treatment plan along with follow-up of her obesity related diagnoses. Angelica Young is on keeping a food journal and adhering to recommended goals of 1100-1200 calories and 85 grams of protein and states she is following her eating plan approximately 30% of the time. Angelica Young states she is exercising for 0 minutes 0 times per week.  Today's visit was #: 8 Starting weight: 182 lbs Starting date: 12/21/2019 Today's weight: 180 lbs Today's date: 07/02/2020 Total lbs lost to date: 2 lbs Total lbs lost since last in-office visit: 0 Total weight loss percentage to date: -1.10%  Interim History: Angelica Young is off Megace.  She stopped it one week ago.  She says she has been journaling.  She has increased her water intake and decreased snacking.   Assessment/Plan:   1. Prediabetes Goal is HgbA1c < 5.7 and insulin level closer to 5.   Lab Results  Component Value Date   HGBA1C 5.5 07/02/2020   Lab Results  Component Value Date   INSULIN 13.4 12/26/2019   - Comprehensive metabolic panel - Insulin, Free (Bioactive) - Lipid panel - Hemoglobin A1c  2. Essential hypertension At goal. The current medical regimen is effective;  continue present plan and medications.  BP Readings from Last 3 Encounters:  07/02/20 120/87  06/24/20 115/81  06/11/20 115/81   3. Vitamin D deficiency Current vitamin D is 48.5, tested on 12/26/2019. Not at goal. Optimal goal > 50 ng/dL. There is also evidence to support a goal of >70 ng/dL in patients with cancer and heart disease. Plan: Continue Vitamin D @50 ,000 IU every week with follow-up for routine testing of Vitamin D at least 2-3 times per year to avoid over-replacement.  - VITAMIN D 25 Hydroxy (Vit-D Deficiency, Fractures)  4. Polyphagia Intensive lifestyle modifications are the first line treatment for this issue. We discussed several lifestyle modifications today and she  will continue to work on diet, exercise and weight loss efforts. Orders and follow up as documented in patient record.  Counseling . Polyphagia is excessive hunger. . Causes can include: low blood sugars, hypERthyroidism, PMS, lack of sleep, stress, insulin resistance, diabetes, certain medications, and diets that are deficient in protein and fiber.   5. Status post D&C For endometriosis. Followed by GYN. Wants to exercise soon.  - CBC with Differential/Platelet - Anemia panel  6. At risk for activity intolerance Tanija was given approximately 15 minutes of exercise intolerance counseling today. She is 39 y.o. female and has risk factors exercise intolerance including her recent GYN procedure. Makynlie will slowly increase activity as tolerated.  7. Class 1 obesity with serious comorbidity and body mass index (BMI) of 33.0 to 33.9 in adult, unspecified obesity type Angelica Young is currently in the action stage of change. As such, her goal is to continue with weight loss efforts. She has agreed to keeping a food journal and adhering to recommended goals of 1100-1200 calories and 85 grams of protein.   Exercise goals: Get clearance from GYN.  Behavioral modification strategies: increasing lean protein intake, decreasing simple carbohydrates, increasing vegetables and increasing water intake.  Evani has agreed to follow-up with our clinic in 2-3 weeks. She was informed of the importance of frequent follow-up visits to maximize her success with intensive lifestyle modifications for her multiple health conditions.   Lissete was informed we would discuss her lab results at her next visit unless there is a critical issue that  needs to be addressed sooner. Temima agreed to keep her next visit at the agreed upon time to discuss these results.  Objective:   Blood pressure 120/87, pulse 75, temperature 98.5 F (36.9 C), temperature source Oral, height 5\' 2"  (1.575 m), weight 180 lb (81.6 kg), SpO2 98  %. Body mass index is 32.92 kg/m.  General: Cooperative, alert, well developed, in no acute distress. HEENT: Conjunctivae and lids unremarkable. Cardiovascular: Regular rhythm.  Lungs: Normal work of breathing. Neurologic: No focal deficits.   Lab Results  Component Value Date   CREATININE 0.65 07/02/2020   BUN 9 07/02/2020   NA 143 07/02/2020   K 3.8 07/02/2020   CL 105 07/02/2020   CO2 24 07/02/2020   Lab Results  Component Value Date   ALT 56 (H) 07/02/2020   AST 34 07/02/2020   ALKPHOS 60 07/02/2020   BILITOT <0.2 07/02/2020   Lab Results  Component Value Date   HGBA1C 5.5 07/02/2020   HGBA1C 5.6 12/26/2019   Lab Results  Component Value Date   INSULIN 13.4 12/26/2019   Lab Results  Component Value Date   CHOL 174 07/02/2020   HDL 48 07/02/2020   LDLCALC 116 (H) 07/02/2020   TRIG 49 07/02/2020   CHOLHDL 3.6 07/02/2020   Lab Results  Component Value Date   WBC 7.1 07/02/2020   HGB 13.0 07/02/2020   HCT 39.6 07/02/2020   MCV 93 07/02/2020   PLT 324 07/02/2020   Lab Results  Component Value Date   IRON 77 07/02/2020   TIBC 368 07/02/2020   FERRITIN 22 07/02/2020   Attestation Statements:   Reviewed by clinician on day of visit: allergies, medications, problem list, medical history, surgical history, family history, social history, and previous encounter notes.  I, 07/04/2020, CMA, am acting as transcriptionist for Insurance claims handler, DO  I have reviewed the above documentation for accuracy and completeness, and I agree with the above. Helane Rima, DO

## 2020-07-05 DIAGNOSIS — H6122 Impacted cerumen, left ear: Secondary | ICD-10-CM | POA: Diagnosis not present

## 2020-07-05 DIAGNOSIS — Z23 Encounter for immunization: Secondary | ICD-10-CM | POA: Diagnosis not present

## 2020-07-10 ENCOUNTER — Encounter: Payer: Self-pay | Admitting: Internal Medicine

## 2020-07-16 ENCOUNTER — Encounter: Payer: Self-pay | Admitting: Obstetrics and Gynecology

## 2020-07-19 ENCOUNTER — Other Ambulatory Visit: Payer: Self-pay

## 2020-07-19 DIAGNOSIS — Z30431 Encounter for routine checking of intrauterine contraceptive device: Secondary | ICD-10-CM

## 2020-07-19 NOTE — Telephone Encounter (Signed)
How soon can we get her in for an ultrasound to check on the IUD position?

## 2020-07-19 NOTE — Telephone Encounter (Signed)
Dr. Penni Bombard, We do not have an ultrasound tech until Wednesday of next week. There are available u/s appts that day. Will that be okay?

## 2020-07-19 NOTE — Telephone Encounter (Signed)
Yes thanks 

## 2020-07-21 ENCOUNTER — Other Ambulatory Visit: Payer: Self-pay | Admitting: Internal Medicine

## 2020-07-21 DIAGNOSIS — F3289 Other specified depressive episodes: Secondary | ICD-10-CM

## 2020-07-24 ENCOUNTER — Other Ambulatory Visit: Payer: Self-pay

## 2020-07-24 ENCOUNTER — Encounter: Payer: Self-pay | Admitting: Obstetrics and Gynecology

## 2020-07-24 ENCOUNTER — Ambulatory Visit: Payer: BLUE CROSS/BLUE SHIELD | Admitting: Obstetrics and Gynecology

## 2020-07-24 ENCOUNTER — Ambulatory Visit (INDEPENDENT_AMBULATORY_CARE_PROVIDER_SITE_OTHER): Payer: BLUE CROSS/BLUE SHIELD

## 2020-07-24 VITALS — BP 130/84

## 2020-07-24 DIAGNOSIS — Z30431 Encounter for routine checking of intrauterine contraceptive device: Secondary | ICD-10-CM

## 2020-07-24 DIAGNOSIS — N939 Abnormal uterine and vaginal bleeding, unspecified: Secondary | ICD-10-CM

## 2020-07-24 DIAGNOSIS — Z8742 Personal history of other diseases of the female genital tract: Secondary | ICD-10-CM | POA: Diagnosis not present

## 2020-07-24 NOTE — Progress Notes (Signed)
Angelica Young 11/21/1980 [id number]  SUBJECTIVE:  39 y.o. G2P0020 female presents for a pelvic ultrasound to evaluate IUD position.  On 06/24/2020 she had a hysteroscopy D&C and Mirena IUD placement for abnormal uterine bleeding.  She had benign endometrial tissue and polyp tissue removed.  Since the IUD placement in particular she has been feeling daily pelvic cramping but finds relief with use of Pamprin twice a day and also with getting up and ambulating.  She has more recently started Metformin as part of a weight loss regimen and to address prediabetic status, she also admits to having some constipation.  She has not had any heavy bleeding, she did stop the Megace.  Most days she either has pink tinge on the tissue with wiping after using the restroom or some slight spotting.  Current Outpatient Medications  Medication Sig Dispense Refill  . acetaminophen (TYLENOL) 500 MG tablet Take 2 tablets (1,000 mg total) by mouth every 6 (six) hours as needed for mild pain.    . chlorthalidone (HYGROTON) 25 MG tablet Take 1 tablet (25 mg total) by mouth daily. 90 tablet 1  . FLUoxetine (PROZAC) 40 MG capsule Take 1 capsule (40 mg total) by mouth daily. 30 capsule 0  . levonorgestrel (MIRENA) 20 MCG/24HR IUD 1 each by Intrauterine route once.    . metFORMIN (GLUCOPHAGE) 500 MG tablet Take 1 tablet (500 mg total) by mouth 2 (two) times daily. 60 tablet 1  . Multiple Vitamin (MULTIVITAMIN PO) Take 30 mLs by mouth daily. Source of life    . SUMAtriptan (IMITREX) 100 MG tablet Take 1 tablet (100 mg total) by mouth once as needed for up to 1 dose. May repeat in 2 hours if headache persists or recurs. 10 tablet 12  . verapamil (CALAN-SR) 180 MG CR tablet Take 180 mg by mouth at bedtime.     No current facility-administered medications for this visit.   Allergies: Vicodin [hydrocodone-acetaminophen] and Sulfa antibiotics  No LMP recorded. (Menstrual status: IUD).  Past medical history,surgical history,  problem list, medications, allergies, family history and social history were all reviewed and documented as reviewed in the EPIC chart.    OBJECTIVE:  BP 130/84  Pelvic ultrasound Anteverted uterus 7.9 x 4.8 x 3.8 cm No myometrial masses IUD within the endometrial cavity in a normal position Endometrial thickness 1.8 mm Bilateral ovaries with normal follicle pattern.  Right ovary with a collapsed corpus luteal cyst 1.3 x 1.4 cm.  Hemorrhagic and internal debris components. No adnexal masses.  No free fluid.   ASSESSMENT:  39 y.o. G2P0020 here for pelvic ultrasound and IUD checkup  PLAN:  The patient is reassured that the pelvic ultrasound indicates an IUD is in a normal position within the uterus.  She has a very small resolving corpus luteum cyst on the right ovary.  I do not think this would be causing her any significant pelvic pain given its very small size and resolving nature, no need for ultrasound follow-up of the tiny cyst.  I do question if a combination of constipation and/or Metformin use has been causing her some pelvic cramping so I encouraged her to try some remedies to achieve more regular bowel movements and she will also be discussing with her other providers regarding Metformin use and possibly decreasing the dose.  So far the IUD seems to be working in terms of controlling the recurrent menorrhagia issue and I suspect that in the next few months that the abnormal bleeding pattern will improve as  that is a pretty typical symptom with initial hormonal IUD usage.  She does still think about potential fertility in the future and I recommended that she discuss with an REI specialist the possibility of oocyte freezing.  I will have staff contact her to help her with a referral.  She will follow-up with [place] as needed moving forward.   Theresia Majors MD 07/24/20

## 2020-07-25 ENCOUNTER — Telehealth: Payer: Self-pay | Admitting: *Deleted

## 2020-07-25 NOTE — Telephone Encounter (Signed)
-----   Message from Theresia Majors, MD sent at 07/24/2020 10:58 AM EDT ----- Regarding: REI referral Hi, can we help Ms. Angelica Young with a referral to reproductive endocrinology for consult regarding oocyte freezing?  Thank you

## 2020-07-25 NOTE — Telephone Encounter (Signed)
Referral faxed to [facility] for the below note they will call to schedule.

## 2020-08-01 ENCOUNTER — Ambulatory Visit (INDEPENDENT_AMBULATORY_CARE_PROVIDER_SITE_OTHER): Payer: BLUE CROSS/BLUE SHIELD | Admitting: Family Medicine

## 2020-08-01 ENCOUNTER — Encounter (INDEPENDENT_AMBULATORY_CARE_PROVIDER_SITE_OTHER): Payer: Self-pay | Admitting: Family Medicine

## 2020-08-01 ENCOUNTER — Other Ambulatory Visit: Payer: Self-pay

## 2020-08-01 VITALS — BP 122/86 | HR 77 | Temp 98.2°F | Ht 62.0 in | Wt 183.0 lb

## 2020-08-01 DIAGNOSIS — R7401 Elevation of levels of liver transaminase levels: Secondary | ICD-10-CM

## 2020-08-01 DIAGNOSIS — Z6833 Body mass index (BMI) 33.0-33.9, adult: Secondary | ICD-10-CM

## 2020-08-01 DIAGNOSIS — Z9189 Other specified personal risk factors, not elsewhere classified: Secondary | ICD-10-CM

## 2020-08-01 DIAGNOSIS — R7303 Prediabetes: Secondary | ICD-10-CM | POA: Diagnosis not present

## 2020-08-01 DIAGNOSIS — R790 Abnormal level of blood mineral: Secondary | ICD-10-CM

## 2020-08-01 DIAGNOSIS — E669 Obesity, unspecified: Secondary | ICD-10-CM | POA: Diagnosis not present

## 2020-08-01 MED ORDER — METFORMIN HCL ER 500 MG PO TB24: 500.0000 mg | ORAL_TABLET | Freq: Every day | ORAL | 0 refills | Status: DC

## 2020-08-01 NOTE — Progress Notes (Signed)
Chief Complaint:   OBESITY Angelica Young is here to discuss her progress with her obesity treatment plan along with follow-up of her obesity related diagnoses.   Today's visit was #: 9 Starting weight: 182 lbs Starting date: 12/21/2019 Today's weight: 183 lbs Today's date: 08/07/2020 Total lbs lost to date: +1 lb Body mass index is 33.47 kg/m.   Interim History: Angelica Young has a Veterinary surgeon and is working on boundaries.  Her counselor's name is [employer].  She saw her on October 14, and her next appointment is in 2 weeks.  She says she has been keeping a journal and has been working on reversing negative thoughts.   Nutrition Plan:  Keeping a food journal and adhering to recommended goals of 1100-1200 calories and 85 grams of protein for 30% of the time. Anti-obesity medications: metformin . Reported side effects: none. Hunger is moderately controlled controlled. Cravings are moderately controlled controlled.  Activity: Walking for 40 minutes 4 times per week.  Assessment/Plan:   1. Elevated alanine aminotransferase (ALT) level Discussed labs with patient today.  Elevated liver transaminases with an ALT predominance combined with obesity and insulin resistance is characteristic, but not diagnostic of non-alcoholic fatty liver disease (NAFLD). NAFLD is the 2nd leading cause of liver transplant in adults. Treatment includes weight loss, elimination of sweet drinks, including juice, avoidance of high fructose corn syrup, and exercise. As always, avoiding alcohol consumption is important.  Plan:  Recheck labs at next visit.  Lab Results  Component Value Date   ALT 56 (H) 07/02/2020   AST 34 07/02/2020   ALKPHOS 60 07/02/2020   BILITOT <0.2 07/02/2020   2. Low serum ferritin level Discussed labs with patient today.  Nutrition: Iron-rich foods include dark leafy greens, red and white meats, eggs, seafood, and beans.  Certain foods and drinks prevent your body from absorbing iron  properly. Avoid eating these foods in the same meal as iron-rich foods or with iron supplements. These foods include: coffee, black tea, and red wine; milk, dairy products, and foods that are high in calcium; beans and soybeans; whole grains. Constipation can be a side effect of iron supplementation. Increased water and fiber intake are helpful. Water goal: > 2 liters/day. Fiber goal: > 25 grams/day.  3. Prediabetes, Dx June 2020 Discussed labs with patient today.  Goal is HgbA1c < 5.7 and insulin level closer to 5.  Angelica Young is taking metformin XR 500 mg daily.  Plan:  Refill metformin, as per below.  Lab Results  Component Value Date   HGBA1C 5.5 07/02/2020   Lab Results  Component Value Date   INSULIN 13.4 12/26/2019   - Refill metFORMIN (GLUCOPHAGE-XR) 500 MG 24 hr tablet; Take 1 tablet (500 mg total) by mouth daily with breakfast.  Dispense: 30 tablet; Refill: 0  4. At risk for heart disease Angelica Young was given approximately 15 minutes of coronary artery disease prevention counseling today. She is 39 y.o. female and has risk factors for heart disease including obesity and IR. We discussed intensive lifestyle modifications today with an emphasis on specific weight loss instructions and strategies.   During insulin resistance, several metabolic alterations induce the development of cardiovascular disease. For instance, insulin resistance can induce an imbalance in glucose metabolism that generates chronic hyperglycemia, which in turn triggers oxidative stress and causes an inflammatory response that leads to cell damage. Insulin resistance can also alter systemic lipid metabolism which then leads to the development of dyslipidemia and the well-known lipid triad: (1) high  levels of plasma triglycerides, (2) low levels of high-density lipoprotein, and (3) the appearance of small dense low-density lipoproteins. This triad, along with endothelial dysfunction, which can also be induced by aberrant  insulin signaling, contribute to atherosclerotic plaque formation.   5. Class 1 obesity with serious comorbidity and body mass index (BMI) of 33.0 to 33.9 in adult, unspecified obesity type  Angelica Young is currently in the action stage of change. As such, her goal is to continue with weight loss efforts.   Nutrition goals: She has agreed to keeping a food journal and adhering to recommended goals of 1100-1200 calories and 85 grams of protein.   Exercise goals: Increase vegetable intake and increase amount of walking, which will increase energy.  Behavioral modification strategies: increasing lean protein intake, decreasing simple carbohydrates, increasing vegetables and increasing water intake.  Angelica Young has agreed to follow-up with our clinic in 2 weeks. She was informed of the importance of frequent follow-up visits to maximize her success with intensive lifestyle modifications for her multiple health conditions.   Objective:   Blood pressure 122/86, pulse 77, temperature 98.2 F (36.8 C), temperature source Oral, height 5\' 2"  (1.575 m), weight 183 lb (83 kg), SpO2 96 %. Body mass index is 33.47 kg/m.  General: Cooperative, alert, well developed, in no acute distress. HEENT: Conjunctivae and lids unremarkable. Cardiovascular: Regular rhythm.  Lungs: Normal work of breathing. Neurologic: No focal deficits.   Lab Results  Component Value Date   CREATININE 0.65 07/02/2020   BUN 9 07/02/2020   NA 143 07/02/2020   K 3.8 07/02/2020   CL 105 07/02/2020   CO2 24 07/02/2020   Lab Results  Component Value Date   ALT 56 (H) 07/02/2020   AST 34 07/02/2020   ALKPHOS 60 07/02/2020   BILITOT <0.2 07/02/2020   Lab Results  Component Value Date   HGBA1C 5.5 07/02/2020   HGBA1C 5.6 12/26/2019   Lab Results  Component Value Date   INSULIN 13.4 12/26/2019   Lab Results  Component Value Date   CHOL 174 07/02/2020   HDL 48 07/02/2020   LDLCALC 116 (H) 07/02/2020   TRIG 49 07/02/2020     CHOLHDL 3.6 07/02/2020   Lab Results  Component Value Date   WBC 7.1 07/02/2020   HGB 13.0 07/02/2020   HCT 39.6 07/02/2020   MCV 93 07/02/2020   PLT 324 07/02/2020   Lab Results  Component Value Date   IRON 77 07/02/2020   TIBC 368 07/02/2020   FERRITIN 22 07/02/2020   Attestation Statements:   Reviewed by clinician on day of visit: allergies, medications, problem list, medical history, surgical history, family history, social history, and previous encounter notes.  I, 07/04/2020, CMA, am acting as transcriptionist for Insurance claims handler, DO  I have reviewed the above documentation for accuracy and completeness, and I agree with the above. Helane Rima, DO

## 2020-08-05 ENCOUNTER — Ambulatory Visit: Payer: BLUE CROSS/BLUE SHIELD | Admitting: Family Medicine

## 2020-08-15 ENCOUNTER — Encounter (INDEPENDENT_AMBULATORY_CARE_PROVIDER_SITE_OTHER): Payer: Self-pay | Admitting: Adult Health

## 2020-08-15 ENCOUNTER — Other Ambulatory Visit: Payer: Self-pay

## 2020-08-15 ENCOUNTER — Ambulatory Visit (INDEPENDENT_AMBULATORY_CARE_PROVIDER_SITE_OTHER): Payer: BLUE CROSS/BLUE SHIELD | Admitting: Adult Health

## 2020-08-15 VITALS — BP 124/87 | HR 100 | Temp 98.2°F | Ht 62.0 in | Wt 183.0 lb

## 2020-08-15 DIAGNOSIS — I10 Essential (primary) hypertension: Secondary | ICD-10-CM | POA: Diagnosis not present

## 2020-08-15 DIAGNOSIS — E669 Obesity, unspecified: Secondary | ICD-10-CM

## 2020-08-15 DIAGNOSIS — R7401 Elevation of levels of liver transaminase levels: Secondary | ICD-10-CM

## 2020-08-15 DIAGNOSIS — N809 Endometriosis, unspecified: Secondary | ICD-10-CM

## 2020-08-15 DIAGNOSIS — Z6833 Body mass index (BMI) 33.0-33.9, adult: Secondary | ICD-10-CM

## 2020-08-15 DIAGNOSIS — R7303 Prediabetes: Secondary | ICD-10-CM

## 2020-08-15 NOTE — Progress Notes (Signed)
1 

## 2020-08-15 NOTE — Progress Notes (Signed)
Chief Complaint:   OBESITY Angelica Young is here to discuss her progress with her obesity treatment plan along with follow-up of her obesity related diagnoses. 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 Angelica Young states she is walking 30 minutes 3 times per week.  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 Her employees are in [place], [place], and [place]. She traveled to [place] for 5 days for work and did not track while traveling. Now that she is home, she resumed tracking with premium My Fitness Pal tracker.  Subjective:   Essential hypertension. Blood pressure is excellent; heart rate is elevated at 100. Angelica Young is on verapamil 180 mg daily and chlorthalidone 25 mg daily.  BP Readings from Last 3 Encounters:  08/15/20 124/87  08/01/20 122/86  07/24/20 130/84   Lab Results  Component Value Date   CREATININE 0.65 07/02/2020   CREATININE 0.6 11/29/2019   CREATININE 0.60 10/12/2019   Prediabetes. Angelica Young has a diagnosis of prediabetes based on her elevated HgA1c and was informed this puts her at greater risk of developing diabetes. She continues to work on diet and exercise to decrease her risk of diabetes. She denies nausea or hypoglycemia. 07/02/2020 blood glucose 96, A1c 5.5. Angelica Young was on metformin XR 500 mg, but stopped due to abdominal cramping.  Lab Results  Component Value Date   HGBA1C 5.5 07/02/2020   Lab Results  Component Value Date   INSULIN 13.4 12/26/2019   Endometriosis. Angelica Young had a Mirena IUD inserted on 06/24/2020 by Dr. Penni Bombard of [facility]. Abdominal ultrasound on  07/24/2020 showed IUD within the endometrial cavity in a normal position.  Elevated alanine aminotransferase (ALT) level. ALT on 07/02/2020 was elevated at 56. Angelica Young has been using Pamprin frequently for abdominal cramping.   Ref. Range 07/02/2020 08:19  ALT Latest Ref Range: 0 - 32 IU/L 56 (H)   Assessment/Plan:   Essential hypertension. Brindy is working on healthy weight loss and exercise to improve blood pressure control. We will watch for signs of hypotension as she continues her lifestyle modifications. She will continue her current antihypertensive therapy as directed.   Prediabetes. Angelica Young will continue to work on weight loss, exercise, and decreasing simple carbohydrates to help decrease the risk of diabetes. She will restart metformin XR 500 mg QAM with a full meal.  Endometriosis. Angelica Young will continue close follow-up with Dr. Penni Bombard as scheduled.  Elevated alanine aminotransferase (ALT) level. Angelica Young was instructed to use Pamprin sparingly. We recommend heating pad, stretching, and exercise to treat pain. Recommend NSAIDs for pain control over Acetaminophen.  Class 1 obesity with serious comorbidity and body mass index (BMI) of 33.0 to 33.9 in adult, unspecified obesity type.  Angelica Young is currently in the action stage of change. As such, her goal is to continue with weight loss efforts. She has agreed to keeping a food journal and adhering to recommended goals of 1100-1200 calories and 85 grams of protein daily.  Exercise goals: Angelica Young will do You Tube exercises at 5 minute increments 3 times a day 3 times a week.  Behavioral modification strategies: increasing lean protein intake, meal planning and cooking  strategies, planning for success and keeping a strict food journal.  Angelica Young has agreed to follow-up with our clinic in 2 weeks. She was informed of the importance of frequent follow-up visits to maximize her success with intensive lifestyle modifications for her multiple  health conditions.   Objective:   Blood pressure 124/87, pulse 100, temperature 98.2 F (36.8 C), height 5\' 2"  (1.575 m), weight 183 lb (83 kg), SpO2 97 %. Body mass index is 33.47 kg/m.  General: Cooperative, alert, well developed, in no acute distress. HEENT: Conjunctivae and lids unremarkable. Cardiovascular: Regular rhythm.  Lungs: Normal work of breathing. Neurologic: No focal deficits.   Lab Results  Component Value Date   CREATININE 0.65 07/02/2020   BUN 9 07/02/2020   NA 143 07/02/2020   K 3.8 07/02/2020   CL 105 07/02/2020   CO2 24 07/02/2020   Lab Results  Component Value Date   ALT 56 (H) 07/02/2020   AST 34 07/02/2020   ALKPHOS 60 07/02/2020   BILITOT <0.2 07/02/2020   Lab Results  Component Value Date   HGBA1C 5.5 07/02/2020   HGBA1C 5.6 12/26/2019   Lab Results  Component Value Date   INSULIN 13.4 12/26/2019   No results found for: TSH Lab Results  Component Value Date   CHOL 174 07/02/2020   HDL 48 07/02/2020   LDLCALC 116 (H) 07/02/2020   TRIG 49 07/02/2020   CHOLHDL 3.6 07/02/2020   Lab Results  Component Value Date   WBC 7.1 07/02/2020   HGB 13.0 07/02/2020   HCT 39.6 07/02/2020   MCV 93 07/02/2020   PLT 324 07/02/2020   Lab Results  Component Value Date   IRON 77 07/02/2020   TIBC 368 07/02/2020   FERRITIN 22 07/02/2020   Attestation Statements:   Reviewed by clinician on day of visit: allergies, medications, problem list, medical history, surgical history, family history, social history, and previous encounter notes.  Time spent on visit including pre-visit chart review and post-visit charting and care was 35 minutes.   I, 07/04/2020, am acting as Marianna Payment for Energy manager, NP-C   I have reviewed the above documentation for accuracy and completeness, and I agree with the above. -  Amariyana Heacox d. Makari Portman, NP-C

## 2020-08-21 ENCOUNTER — Ambulatory Visit: Payer: BLUE CROSS/BLUE SHIELD | Admitting: Obstetrics and Gynecology

## 2020-08-21 ENCOUNTER — Other Ambulatory Visit (INDEPENDENT_AMBULATORY_CARE_PROVIDER_SITE_OTHER): Payer: Self-pay | Admitting: Family Medicine

## 2020-08-21 DIAGNOSIS — R7303 Prediabetes: Secondary | ICD-10-CM

## 2020-08-21 NOTE — Telephone Encounter (Signed)
Dr.Wallace °

## 2020-08-29 ENCOUNTER — Other Ambulatory Visit: Payer: Self-pay

## 2020-08-29 ENCOUNTER — Other Ambulatory Visit: Payer: Self-pay | Admitting: Internal Medicine

## 2020-08-29 ENCOUNTER — Ambulatory Visit (INDEPENDENT_AMBULATORY_CARE_PROVIDER_SITE_OTHER): Payer: BLUE CROSS/BLUE SHIELD | Admitting: Family Medicine

## 2020-08-29 ENCOUNTER — Encounter (INDEPENDENT_AMBULATORY_CARE_PROVIDER_SITE_OTHER): Payer: Self-pay | Admitting: Family Medicine

## 2020-08-29 VITALS — BP 116/87 | HR 89 | Temp 98.2°F | Ht 62.0 in | Wt 185.0 lb

## 2020-08-29 DIAGNOSIS — E669 Obesity, unspecified: Secondary | ICD-10-CM

## 2020-08-29 DIAGNOSIS — Z9189 Other specified personal risk factors, not elsewhere classified: Secondary | ICD-10-CM

## 2020-08-29 DIAGNOSIS — F3289 Other specified depressive episodes: Secondary | ICD-10-CM

## 2020-08-29 DIAGNOSIS — Z6834 Body mass index (BMI) 34.0-34.9, adult: Secondary | ICD-10-CM

## 2020-08-29 DIAGNOSIS — I10 Essential (primary) hypertension: Secondary | ICD-10-CM | POA: Diagnosis not present

## 2020-08-29 DIAGNOSIS — R7303 Prediabetes: Secondary | ICD-10-CM | POA: Diagnosis not present

## 2020-08-29 MED ORDER — METFORMIN HCL ER 750 MG PO TB24: 750.0000 mg | ORAL_TABLET | Freq: Every day | ORAL | 0 refills | Status: DC

## 2020-08-29 MED ORDER — FLUOXETINE HCL 40 MG PO CAPS: 40.0000 mg | ORAL_CAPSULE | Freq: Every day | ORAL | 0 refills | Status: DC

## 2020-09-02 NOTE — Progress Notes (Signed)
Chief Complaint:   OBESITY Angelica Young is here to discuss her progress with her obesity treatment plan along with follow-up of her obesity related diagnoses.   Today's visit was #: 11 Starting weight: 182 lbs Starting date: 12/21/2019 Today's weight: 185 lbs Today's date: 08/29/2020 Total lbs lost to date: +3 Body mass index is 33.84 kg/m.   Interim History: Angelica Young says that the things that give her energy are exercise, getting caught up and reading/bible.  The things that drain her are her mom (loss of control). In the morning, she has eggs and mushrooms and pancakes with blueberries and butter.  Lasts throughout the day.  For lunch she has a vegetable and meat stir-fry.   Nutrition Plan: keeping a food journal and adhering to recommended goals of 1100-1200 calories and 85 grams of protein for 50% of the time.  Anti-obesity medications: metformin. Reported side effects: None. Hunger is moderately controlled controlled. Cravings are moderately controlled controlled.  Activity: Walking for 40 minutes 4 times per week.  Assessment/Plan:   1. Prediabetes At goal. Goal is HgbA1c < 5.7.  Medication: metformin XR 750 mg daily.  She will continue to focus on protein-rich, low simple carbohydrate foods. We reviewed the importance of hydration, regular exercise for stress reduction, and restorative sleep.   Lab Results  Component Value Date   HGBA1C 5.5 07/02/2020   Lab Results  Component Value Date   INSULIN 13.4 12/26/2019   - Refill metFORMIN (GLUCOPHAGE XR) 750 MG 24 hr tablet; Take 1 tablet (750 mg total) by mouth daily with breakfast.  Dispense: 30 tablet; Refill: 0  2. Essential hypertension At goal. Medications: chlorthalidone, verapamil.   Plan: Avoid buying foods that are: processed, frozen, or prepackaged to avoid excess salt. We will continue to monitor symptoms as they relate to her weight loss journey.  BP Readings from Last 3 Encounters:  08/29/20 116/87  08/15/20  124/87  08/01/20 122/86   Lab Results  Component Value Date   CREATININE 0.65 07/02/2020   3. Other depression with emotional eating Will perform depression screen at next visit.  Angelica Young is taking Prozac 20 mg daily.  4. At risk for anxiety Angelica Young was given approximately 9 minutes of anxiety risk counseling today. She has risk factors for anxiety. We discussed the importance of a healthy work life balance, a healthy relationship with food and a good support system.  5. Class 1 obesity with serious comorbidity and body mass index (BMI) of 34.0 to 34.9 in adult, unspecified obesity type  Course: Angelica Young is currently in the action stage of change. As such, her goal is to continue with weight loss efforts.   Nutrition goals: She has agreed to keeping a food journal and adhering to recommended goals of 1100-1200 calories and 85 grams of protein.   Exercise goals: As is.  Behavioral modification strategies: increasing lean protein intake, decreasing simple carbohydrates, increasing vegetables, increasing water intake and emotional eating strategies.  Angelica Young has agreed to follow-up with our clinic in 3 weeks. She was informed of the importance of frequent follow-up visits to maximize her success with intensive lifestyle modifications for her multiple health conditions.   Objective:   Blood pressure 116/87, pulse 89, temperature 98.2 F (36.8 C), height 5\' 2"  (1.575 m), weight 185 lb (83.9 kg), SpO2 99 %. Body mass index is 33.84 kg/m.  General: Cooperative, alert, well developed, in no acute distress. HEENT: Conjunctivae and lids unremarkable. Cardiovascular: Regular rhythm.  Lungs: Normal work of breathing.  Neurologic: No focal deficits.   Lab Results  Component Value Date   CREATININE 0.65 07/02/2020   BUN 9 07/02/2020   NA 143 07/02/2020   K 3.8 07/02/2020   CL 105 07/02/2020   CO2 24 07/02/2020   Lab Results  Component Value Date   ALT 56 (H) 07/02/2020   AST 34  07/02/2020   ALKPHOS 60 07/02/2020   BILITOT <0.2 07/02/2020   Lab Results  Component Value Date   HGBA1C 5.5 07/02/2020   HGBA1C 5.6 12/26/2019   Lab Results  Component Value Date   INSULIN 13.4 12/26/2019   No results found for: TSH Lab Results  Component Value Date   CHOL 174 07/02/2020   HDL 48 07/02/2020   LDLCALC 116 (H) 07/02/2020   TRIG 49 07/02/2020   CHOLHDL 3.6 07/02/2020   Lab Results  Component Value Date   WBC 7.1 07/02/2020   HGB 13.0 07/02/2020   HCT 39.6 07/02/2020   MCV 93 07/02/2020   PLT 324 07/02/2020   Lab Results  Component Value Date   IRON 77 07/02/2020   TIBC 368 07/02/2020   FERRITIN 22 07/02/2020   Attestation Statements:   Reviewed by clinician on day of visit: allergies, medications, problem list, medical history, surgical history, family history, social history, and previous encounter notes.  I, Insurance claims handler, CMA, am acting as transcriptionist for Helane Rima, DO  I have reviewed the above documentation for accuracy and completeness, and I agree with the above. Helane Rima, DO

## 2020-09-16 ENCOUNTER — Encounter (INDEPENDENT_AMBULATORY_CARE_PROVIDER_SITE_OTHER): Payer: Self-pay | Admitting: Adult Health

## 2020-09-16 ENCOUNTER — Ambulatory Visit (INDEPENDENT_AMBULATORY_CARE_PROVIDER_SITE_OTHER): Payer: BLUE CROSS/BLUE SHIELD | Admitting: Adult Health

## 2020-09-16 ENCOUNTER — Other Ambulatory Visit: Payer: Self-pay

## 2020-09-16 VITALS — BP 126/88 | HR 72 | Temp 98.2°F | Ht 62.0 in | Wt 188.0 lb

## 2020-09-16 DIAGNOSIS — Z6834 Body mass index (BMI) 34.0-34.9, adult: Secondary | ICD-10-CM

## 2020-09-16 DIAGNOSIS — E669 Obesity, unspecified: Secondary | ICD-10-CM | POA: Diagnosis not present

## 2020-09-16 DIAGNOSIS — R7303 Prediabetes: Secondary | ICD-10-CM

## 2020-09-16 DIAGNOSIS — F3289 Other specified depressive episodes: Secondary | ICD-10-CM

## 2020-09-17 NOTE — Progress Notes (Signed)
Chief Complaint:   OBESITY Angelica Young is here to discuss her progress with her obesity treatment plan along with follow-up of her obesity related diagnoses. 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 [name] states she is walking 40 minutes 4 times per week.  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 She will be participating in the 5K "Running of the Balls" with her cousin this weekend.  She states "I am going to try schedule an fun activity/event one per quarter".  Subjective:   Prediabetes. Ladine has a diagnosis of prediabetes based on her elevated HgA1c and was informed this puts her at greater risk of developing diabetes. She continues to work on diet and exercise to decrease her risk of diabetes. She denies nausea or hypoglycemia. [name] is taking metformin XR 750 mg, increased from 500 mg on 08/29/2020. She denies diarrhea with increased biguanide dosage.   Lab Results  Component Value Date   HGBA1C 5.5 07/02/2020   Lab Results  Component Value Date   INSULIN 13.4 12/26/2019   Other depression with emotional eating. [name] is struggling with emotional eating and using food for comfort to the extent that it is negatively impacting her health. She has been working on behavior modification techniques to help reduce her emotional eating and has been somewhat successful. She shows no sign of suicidal or homicidal ideations. [name] reports stable mood. She is on fluoxetine 40  mg daily. She recently resumed regular therapy and is now attending a monthly session.  Assessment/Plan:   Prediabetes. [name] will continue to work on weight loss, exercise, and decreasing simple carbohydrates to help decrease the risk of diabetes. She will continue metformin XR 750 mg daily, journaling, and regular exercise.  Other depression with emotional eating. Behavior modification techniques were discussed today to help [name] deal with her emotional/non-hunger eating behaviors.  Orders and follow up as documented in patient record. Chesley will continue fluoxetine 40 mg daily as directed.   Class 1 obesity with serious comorbidity and body mass index (BMI) of 34.0 to 34.9 in adult, unspecified obesity type.  [name] is currently in the action stage of change. As such, her goal is to continue with weight loss efforts. She has agreed to keeping a food journal and adhering to recommended goals of 1100-1200 calories and 85 grams of protein daily.   Handouts were provided on Multiple Recipes and High Protein/Low Calorie Foods.  Exercise goals: [name] will continue walking 40 minutes 4 times per week.  Behavioral modification strategies: increasing lean protein intake, increasing water intake, meal planning and cooking strategies and planning for success.  [name] has agreed to follow-up with our clinic in January 2022. She was informed of the importance of frequent follow-up visits to maximize her success with intensive lifestyle modifications for her multiple health conditions.   Objective:   Blood pressure 126/88, pulse 72, temperature 98.2 F (36.8 C), height 5\' 2"  (1.575 m), weight 188 lb (85.3 kg), SpO2 100 %. Body mass index is 34.39 kg/m.  General: Cooperative, alert, well developed,  in no acute distress. HEENT: Conjunctivae and lids unremarkable. Cardiovascular: Regular rhythm.  Lungs: Normal work of breathing. Neurologic: No focal deficits.   Lab Results  Component  Value Date   CREATININE 0.65 07/02/2020   BUN 9 07/02/2020   NA 143 07/02/2020   K 3.8 07/02/2020   CL 105 07/02/2020   CO2 24 07/02/2020   Lab Results  Component Value Date   ALT 56 (H) 07/02/2020   AST 34 07/02/2020   ALKPHOS 60 07/02/2020   BILITOT <0.2 07/02/2020   Lab Results  Component Value Date   HGBA1C 5.5 07/02/2020   HGBA1C 5.6 12/26/2019   Lab Results  Component Value Date   INSULIN 13.4 12/26/2019   No results found for: TSH Lab Results  Component Value Date   CHOL 174 07/02/2020   HDL 48 07/02/2020   LDLCALC 116 (H) 07/02/2020   TRIG 49 07/02/2020   CHOLHDL 3.6 07/02/2020   Lab Results  Component Value Date   WBC 7.1 07/02/2020   HGB 13.0 07/02/2020   HCT 39.6 07/02/2020   MCV 93 07/02/2020   PLT 324 07/02/2020   Lab Results  Component Value Date   IRON 77 07/02/2020   TIBC 368 07/02/2020   FERRITIN 22 07/02/2020   Attestation Statements:   Reviewed by clinician on day of visit: allergies, medications, problem list, medical history, surgical history, family history, social history, and previous encounter notes.  Time spent on visit including pre-visit chart review and post-visit charting and care was 32 minutes.   I, Marianna Payment, am acting as Energy manager for [employer], NP-C   I have reviewed the above documentation for accuracy and completeness, and I agree with the above. -  Kasie Leccese d. Jerrilynn Mikowski, NP-C

## 2020-09-19 ENCOUNTER — Other Ambulatory Visit (INDEPENDENT_AMBULATORY_CARE_PROVIDER_SITE_OTHER): Payer: Self-pay | Admitting: Family Medicine

## 2020-09-19 DIAGNOSIS — R7303 Prediabetes: Secondary | ICD-10-CM

## 2020-09-19 NOTE — Telephone Encounter (Signed)
Last OV with Katy 

## 2020-09-23 NOTE — Telephone Encounter (Signed)
Refill request

## 2020-09-27 DIAGNOSIS — F3341 Major depressive disorder, recurrent, in partial remission: Secondary | ICD-10-CM | POA: Diagnosis not present

## 2020-09-27 DIAGNOSIS — I10 Essential (primary) hypertension: Secondary | ICD-10-CM | POA: Diagnosis not present

## 2020-09-30 ENCOUNTER — Ambulatory Visit (INDEPENDENT_AMBULATORY_CARE_PROVIDER_SITE_OTHER): Payer: BLUE CROSS/BLUE SHIELD | Admitting: Family Medicine

## 2020-10-17 ENCOUNTER — Other Ambulatory Visit: Payer: Self-pay

## 2020-10-17 ENCOUNTER — Ambulatory Visit (INDEPENDENT_AMBULATORY_CARE_PROVIDER_SITE_OTHER): Payer: BLUE CROSS/BLUE SHIELD | Admitting: Adult Health

## 2020-10-17 VITALS — BP 115/81 | HR 73 | Temp 98.4°F | Ht 62.0 in | Wt 188.0 lb

## 2020-10-17 DIAGNOSIS — Z6834 Body mass index (BMI) 34.0-34.9, adult: Secondary | ICD-10-CM

## 2020-10-17 DIAGNOSIS — E669 Obesity, unspecified: Secondary | ICD-10-CM

## 2020-10-17 DIAGNOSIS — I10 Essential (primary) hypertension: Secondary | ICD-10-CM

## 2020-10-17 DIAGNOSIS — R7303 Prediabetes: Secondary | ICD-10-CM

## 2020-10-17 DIAGNOSIS — F339 Major depressive disorder, recurrent, unspecified: Secondary | ICD-10-CM

## 2020-10-17 DIAGNOSIS — Z9189 Other specified personal risk factors, not elsewhere classified: Secondary | ICD-10-CM

## 2020-10-17 MED ORDER — METFORMIN HCL ER 500 MG PO TB24: 500.0000 mg | ORAL_TABLET | Freq: Every day | ORAL | 0 refills | Status: DC

## 2020-10-21 ENCOUNTER — Other Ambulatory Visit (INDEPENDENT_AMBULATORY_CARE_PROVIDER_SITE_OTHER): Payer: Self-pay | Admitting: Adult Health

## 2020-10-21 DIAGNOSIS — R7303 Prediabetes: Secondary | ICD-10-CM

## 2020-10-21 NOTE — Progress Notes (Addendum)
Chief Complaint:   OBESITY Angelica Young is here to discuss her progress with her obesity treatment plan along with follow-up of her obesity related diagnoses. Angelica Young is on keeping a food journal and adhering to recommended goals of 1500-1600 calories and 120 g protein and states she is following her eating plan approximately 80% of the time. Angelica Young states she is walking 40 minutes 3 times per week.  Today's visit was #: 13 Starting weight: 182 lbs Starting date: 12/21/2019 Today's weight: 188 lbs Today's date: 10/17/2020 Total lbs lost to date: 0 Total lbs lost since last in-office visit: 0  Interim History: Angelica Young was in track the weeks in between the holidays then feels that she deviated over Christmas and New Years. Her goals for 2022 are to lower her A1c and reduce medication.  Subjective:   1. Essential hypertension Marialice's blood pressure and heart rate are excellent in the office today. She is on chlorthalidone 25 mg daily and verapamil 180 mg daily.  BP Readings from Last 3 Encounters:  10/17/20 115/81  09/16/20 126/88  08/29/20 116/87    2. Pre-diabetes Angelica Young has a diagnosis of prediabetes based on her elevated HgA1c and was informed this puts her at greater risk of developing diabetes. She continues to work on diet and exercise to decrease her risk of diabetes. She reports nausea with Metformin XR 750mg  QD. 07/02/2020 A1c 5.5 with normal blood sugar.  She is on Metformin XR 750 mg daily and feels that this dose is too high and would like to drecrease.  Lab Results  Component Value Date   HGBA1C 5.5 07/02/2020   Lab Results  Component Value Date   INSULIN 13.4 12/26/2019    3. Recurrent major depressive disorder, remission status unspecified (HCC) Angelica Young's behavioral health specialist decreased her SSRI dose of fluoxetine form 40 mg to 20 mg daily. She reports a stable mood and denies suicidal or homicidal ideations.  4. At risk for diabetes mellitus Angelica Young  is at higher than average risk for developing diabetes due to obesity and pre-diabetes.  Assessment/Plan:   1. Essential hypertension Jenniger is working on healthy weight loss and exercise to improve blood pressure control. We will watch for signs of hypotension as she continues her lifestyle modifications. Continue current anti-hypertensive regimen.   2. Pre-diabetes Sui will continue to work on weight loss, exercise, and decreasing simple carbohydrates to help decrease the risk of diabetes. Decrease Metformin XR to 500 mg daily. Refill with new dose instructions sent to pharmacy.  - metFORMIN (GLUCOPHAGE XR) 500 MG 24 hr tablet; Take 1 tablet (500 mg total) by mouth daily with breakfast.  Dispense: 30 tablet; Refill: 0  3. Recurrent major depressive disorder, remission status unspecified (HCC) Continue reduced dose of SSRI. Follow up with behavioral health specialist Feb 2022.  4. At risk for diabetes mellitus Angelica Young was given approximately 15 minutes of diabetes education and counseling today. We discussed intensive lifestyle modifications today with an emphasis on weight loss as well as increasing exercise and decreasing simple carbohydrates in her diet. We also reviewed medication options with an emphasis on risk versus benefit of those discussed.   Repetitive spaced learning was employed today to elicit superior memory formation and behavioral change.  5. Class 1 obesity with serious comorbidity and body mass index (BMI) of 34.0 to 34.9 in adult, unspecified obesity type Angelica Young is currently in the action stage of change. As such, her goal is to continue with weight loss efforts. She has agreed  to keeping a food journal and adhering to recommended goals of 1100-1200 calories and 85 g protein.   Exercise goals: As is  Behavioral modification strategies: increasing lean protein intake, decreasing simple carbohydrates, no skipping meals, meal planning and cooking strategies, planning  for success and keeping a strict food journal.  Angelica Young has agreed to follow-up with our clinic in 2 weeks. She was informed of the importance of frequent follow-up visits to maximize her success with intensive lifestyle modifications for her multiple health conditions.   Objective:   Blood pressure 115/81, pulse 73, temperature 98.4 F (36.9 C), temperature source Oral, height 5\' 2"  (1.575 m), weight 188 lb (85.3 kg), last menstrual period 09/30/2020, SpO2 98 %. Body mass index is 34.39 kg/m.  General: Cooperative, alert, well developed, in no acute distress. HEENT: Conjunctivae and lids unremarkable. Cardiovascular: Regular rhythm.  Lungs: Normal work of breathing. Neurologic: No focal deficits.   Lab Results  Component Value Date   CREATININE 0.65 07/02/2020   BUN 9 07/02/2020   NA 143 07/02/2020   K 3.8 07/02/2020   CL 105 07/02/2020   CO2 24 07/02/2020   Lab Results  Component Value Date   ALT 56 (H) 07/02/2020   AST 34 07/02/2020   ALKPHOS 60 07/02/2020   BILITOT <0.2 07/02/2020   Lab Results  Component Value Date   HGBA1C 5.5 07/02/2020   HGBA1C 5.6 12/26/2019   Lab Results  Component Value Date   INSULIN 13.4 12/26/2019   No results found for: TSH Lab Results  Component Value Date   CHOL 174 07/02/2020   HDL 48 07/02/2020   LDLCALC 116 (H) 07/02/2020   TRIG 49 07/02/2020   CHOLHDL 3.6 07/02/2020   Lab Results  Component Value Date   WBC 7.1 07/02/2020   HGB 13.0 07/02/2020   HCT 39.6 07/02/2020   MCV 93 07/02/2020   PLT 324 07/02/2020   Lab Results  Component Value Date   IRON 77 07/02/2020   TIBC 368 07/02/2020   FERRITIN 22 07/02/2020    Attestation Statements:   Reviewed by clinician on day of visit: allergies, medications, problem list, medical history, surgical history, family history, social history, and previous encounter notes.  Time spent on visit including pre-visit chart review and post-visit care and charting was 35 minutes.    07/04/2020, am acting as Edmund Hilda for Energy manager, NP.  I have reviewed the above documentation for accuracy and completeness, and I agree with the above. -  Katy d. Danford, NP-C

## 2020-10-30 ENCOUNTER — Encounter (INDEPENDENT_AMBULATORY_CARE_PROVIDER_SITE_OTHER): Payer: Self-pay

## 2020-10-31 ENCOUNTER — Other Ambulatory Visit: Payer: Self-pay

## 2020-10-31 ENCOUNTER — Encounter (INDEPENDENT_AMBULATORY_CARE_PROVIDER_SITE_OTHER): Payer: Self-pay | Admitting: Adult Health

## 2020-10-31 ENCOUNTER — Telehealth (INDEPENDENT_AMBULATORY_CARE_PROVIDER_SITE_OTHER): Payer: BLUE CROSS/BLUE SHIELD | Admitting: Adult Health

## 2020-10-31 DIAGNOSIS — F3340 Major depressive disorder, recurrent, in remission, unspecified: Secondary | ICD-10-CM | POA: Diagnosis not present

## 2020-10-31 DIAGNOSIS — E669 Obesity, unspecified: Secondary | ICD-10-CM

## 2020-10-31 DIAGNOSIS — Z6834 Body mass index (BMI) 34.0-34.9, adult: Secondary | ICD-10-CM | POA: Diagnosis not present

## 2020-10-31 DIAGNOSIS — R7303 Prediabetes: Secondary | ICD-10-CM | POA: Diagnosis not present

## 2020-10-31 MED ORDER — METFORMIN HCL ER 500 MG PO TB24: 500.0000 mg | ORAL_TABLET | Freq: Every day | ORAL | 0 refills | Status: DC

## 2020-11-04 NOTE — Progress Notes (Signed)
TeleHealth Visit:  Due to the COVID-19 pandemic, this visit was completed with telemedicine (audio/video) technology to reduce patient and provider exposure as well as to preserve personal protective equipment.   Angelica Young has verbally consented to this TeleHealth visit. The patient is located at home, the provider is located at the [facility] office. The participants in this visit include the listed provider and patient. The visit was conducted today via video.  Chief Complaint: OBESITY Angelica Young is here to discuss her progress with her obesity treatment plan along with follow-up of her obesity related diagnoses. Angelica Young is on keeping a food journal and adhering to recommended goals of [phone] calories and 85 g protein and states she is following her eating plan approximately 90% of the time. Angelica Young states she is doing cardio and weights 30-40 minutes 3 times per week.  Today's visit was #: 14 Starting weight: 182 lbs Starting date: 12/21/2019  Interim History: Pt has eliminated sugar from her diet in the last 5 days and been really increasing protein intake. She has been consistently consuming 50-60 g protein daily and approximately 1400 calories a day. Pt is tracking 90% of the time.  Subjective:   1. Pre-diabetes 07/02/2020 CMP GFR 128 and A1c 5.5. Pt reports significant reduction in dizziness and nausea with the decrease of Metformin XR from 750 mg to 500 mg daily.   Lab Results  Component Value Date   HGBA1C 5.5 07/02/2020   Lab Results  Component Value Date   INSULIN 13.4 12/26/2019    2. Recurrent major depressive disorder, in remission (HCC) Pt reports slight increase in emotional liability since fluoxetine was decreased from 40 mg to 20 mg. She denies suicidal or homicidal ideations. She has been seeing her therapist bi-weekly.  Assessment/Plan:   1. Pre-diabetes Angelica Young will continue to work on weight loss, exercise, and decreasing simple carbohydrates  to help decrease the risk of diabetes. Continue current treatment plan. We will refill Metformin for 30 day supply.  - metFORMIN (GLUCOPHAGE XR) 500 MG 24 hr tablet; Take 1 tablet (500 mg total) by mouth daily with breakfast.  Dispense: 30 tablet; Refill: 0  2. Recurrent major depressive disorder, in remission (HCC) Continue current dose of fluoxetine and close follow up with therapist. Continue regular exercise.  3. Class 1 obesity with serious comorbidity and body mass index (BMI) of 34.0 to 34.9 in adult, unspecified obesity type Angelica Young is currently in the action stage of change. As such, her goal is to continue with weight loss efforts. She has agreed to keeping a food journal and adhering to recommended goals of [phone] calories and 85 g protein.   Exercise goals: As is  Behavioral modification strategies: increasing lean protein intake, decreasing simple carbohydrates, meal planning and cooking strategies and planning for success.  Angelica Young has agreed to follow-up with our clinic in 2 weeks. She was informed of the importance of frequent follow-up visits to maximize her success with intensive lifestyle modifications for her multiple health conditions.  Objective:   VITALS: Per patient if applicable, see vitals. GENERAL: Alert and in no acute distress. CARDIOPULMONARY: No increased WOB. Speaking in clear sentences.  PSYCH: Pleasant and cooperative. Speech normal rate and rhythm. Affect is appropriate. Insight and judgement are appropriate. Attention is focused, linear, and appropriate.  NEURO: Oriented as arrived to appointment on time with no prompting.   Lab Results  Component Value Date   CREATININE 0.65 07/02/2020   BUN 9 07/02/2020   NA 143  07/02/2020   K 3.8 07/02/2020   CL 105 07/02/2020   CO2 24 07/02/2020   Lab Results  Component Value Date   ALT 56 (H) 07/02/2020   AST 34 07/02/2020   ALKPHOS 60 07/02/2020   BILITOT <0.2 07/02/2020   Lab Results  Component  Value Date   HGBA1C 5.5 07/02/2020   HGBA1C 5.6 12/26/2019   Lab Results  Component Value Date   INSULIN 13.4 12/26/2019   No results found for: TSH Lab Results  Component Value Date   CHOL 174 07/02/2020   HDL 48 07/02/2020   LDLCALC 116 (H) 07/02/2020   TRIG 49 07/02/2020   CHOLHDL 3.6 07/02/2020   Lab Results  Component Value Date   WBC 7.1 07/02/2020   HGB 13.0 07/02/2020   HCT 39.6 07/02/2020   MCV 93 07/02/2020   PLT 324 07/02/2020   Lab Results  Component Value Date   IRON 77 07/02/2020   TIBC 368 07/02/2020   FERRITIN 22 07/02/2020    Attestation Statements:   Reviewed by clinician on day of visit: allergies, medications, problem list, medical history, surgical history, family history, social history, and previous encounter notes.  Time spent on visit including pre-visit chart review and post-visit charting and care was 31 minutes.   Edmund Hilda, am acting as Energy manager for William Hamburger, NP.  I have reviewed the above documentation for accuracy and completeness, and I agree with the above. - Audelia Knape d. Caleb Decock, NP-C

## 2020-11-06 NOTE — Progress Notes (Addendum)
Chief Complaint  Patient presents with  . Follow-up    RM 1 alone Pt is well, still having migraines. Had some issues with medication     HISTORY OF PRESENT ILLNESS: Today 11/07/20  Angelica Young is a 40 y.o. female here today for follow up for migraines. She was started on Ajovy and naratriptan at consult visit with Dr Lucia Gaskins 02/2020. She had an accident with second injection and had to order it again. She never took any other injections. She did not try naratriptan. She is now having near daily headaches. She usually takes either Excedrin Migraine, Pamprin, Extra Strength Tylenol or ibuprofen. She rarely takes sumatriptan but does feel it works.    HISTORY (copied from Dr Trevor Mace previous note)  HPI:  Angelica Young is a 40 y.o. female here as requested by Helane Rima, DO for migraines. Migraines started at the age of 40, mother and maternal grandmother had migraines. Migraines worsening. In college she took excedrin migraine which helps acutely, she tried imitrex, topamax, pamprin, worsening migraines in the setting of stress. Migraines start unilaterally either side, throbbing pain, she does not often have an aura but rarely she sees stars, nausea, photo/phonophobia, she has vomited < 5 times, laying down helps, they can last 24-72 hours untreated, sumatriptan helps but has to take 100mg , no numbness, no tingling, + vision changes, can be worse positionally bending over and moving can severely increase head pain, sometimes lying down helps, stress makes it worse, being in the dark helps, she has blurry vision and spots of losses of vision, no iniciting events such as head trauma or injury, she wakes with migraines woke with one this morning at 6am and can be worse in the morning,  no dizziness. 25 headache days a month, 15 moderately severe to severe migraine days a months for the last 5 years. Worsening, no new syptoms.No other focal neurologic deficits, associated symptoms, inciting  events or modifiable factors.  Reviewed notes, labs and imaging from outside physicians, which showed:  hgba1c 5.6 Cbc normal Bmp unremarkable BUN 9 and cr 0.6   Meds tried: imitrex, topamax, prozac, toradol inj, zofran injection, scopolamine patch, zofran injection, baclofen, verapamil, propranolol and metoprolol, amitriptyline.   REVIEW OF SYSTEMS: Out of a complete 14 system review of symptoms, the patient complains only of the following symptoms, headaches and all other reviewed systems are negative.   ALLERGIES: Allergies  Allergen Reactions  . Vicodin [Hydrocodone-Acetaminophen] Nausea And Vomiting  . Sulfa Antibiotics Rash     HOME MEDICATIONS: Outpatient Medications Prior to Visit  Medication Sig Dispense Refill  . Aspirin-Acetaminophen-Caffeine (PAMPRIN MAX PO) Take by mouth.    . chlorthalidone (HYGROTON) 25 MG tablet Take 1 tablet (25 mg total) by mouth daily. 90 tablet 1  . FLUoxetine (PROZAC) 40 MG capsule TAKE 1 CAPSULE BY MOUTH EVERY DAY (Patient taking differently: 20 mg.) 90 capsule 0  . levonorgestrel (MIRENA) 20 MCG/24HR IUD 1 each by Intrauterine route once.    . metFORMIN (GLUCOPHAGE XR) 500 MG 24 hr tablet Take 1 tablet (500 mg total) by mouth daily with breakfast. 30 tablet 0  . Multiple Vitamin (MULTIVITAMIN PO) Take 30 mLs by mouth daily. Source of life    . SUMAtriptan (IMITREX) 100 MG tablet Take 1 tablet (100 mg total) by mouth once as needed for up to 1 dose. May repeat in 2 hours if headache persists or recurs. 10 tablet 12  . verapamil (CALAN-SR) 180 MG CR tablet Take 180 mg by  mouth at bedtime.    . vitamin B-12 (CYANOCOBALAMIN) 100 MCG tablet Take 500 mcg by mouth daily.     No facility-administered medications prior to visit.     PAST MEDICAL HISTORY: Past Medical History:  Diagnosis Date  . Abnormal uterine bleeding (AUB)    LAST 7 WEEKS  . Complication of anesthesia    SLOW TO AWAKEN AT TIMES  . Difficult intravenous access     LAST 6 OF 7 SURGERIES, STUCK X 3 ON 10-16-2019 SURGERY  . Endometriosis 2014  . Hypertension   . Infertility, female   . Migraines   . PONV (postoperative nausea and vomiting)    NAUSEA MOSTLY VOMITING X 2 SURGERIES  . Pre-diabetes   . Skin abnormality    HIZRADENITIS SUPRIVITA  . Sleep apnea    MILD NO CPAP USED IN  4 YEARS  . Vitamin D deficiency      PAST SURGICAL HISTORY: Past Surgical History:  Procedure Laterality Date  . BREAST SURGERY     reduction 2009  . DILATION AND CURETTAGE OF UTERUS    . HERNIA REPAIR  2011   UMBILICAL  . HYSTEROSCOPY WITH D & C N/A 06/24/2020   Procedure: DILATATION AND CURETTAGE /HYSTEROSCOPY; POLYPECTOMY;  Surgeon: Theresia Majors, MD;  Location: [facility];  Service: Gynecology;  Laterality: N/A;  request to follow at 10am in time held for Dr. Penni Bombard. requests 30 minutes  . INTRAUTERINE DEVICE (IUD) INSERTION N/A 06/24/2020   Procedure: INTRAUTERINE DEVICE (IUD) INSERTION WITH MIRENA;  Surgeon: Theresia Majors, MD;  Location: [facility];  Service: Gynecology;  Laterality: N/A;  Mirena IUD insertion/covered under medical plan.  Marland Kitchen LAPAROSCOPIC ASSISTED VENTRAL HERNIA REPAIR N/A 10/16/2019   Procedure: LAPAROSCOPIC ASSISTED VENTRAL HERNIA REPAIR WITH MESH;  Surgeon: Luretha Murphy, MD;  Location: [facility];  Service: General;  Laterality: N/A;  . NM MISC PROCEDURE  [id number]   miscarried   . PELVIC LAPAROSCOPY     x 4 for endometriosis     FAMILY HISTORY: Family History  Problem Relation Age of Onset  . Migraines Mother   . CAD Father   . Hypertension Father   . Stroke Father   . Sleep apnea Father   . Migraines Maternal Grandmother   . Migraines Sister      SOCIAL HISTORY: Social History   Socioeconomic History  . Marital status: Divorced    Spouse name: Not on file  . Number of children: 0  . Years of education: Not on file  . Highest education level: Master's degree (e.g., MA, MS, MEng, MEd, MSW,  MBA)  Occupational History  . Occupation: HR Manager  Tobacco Use  . Smoking status: Never Smoker  . Smokeless tobacco: Never Used  Vaping Use  . Vaping Use: Never used  Substance and Sexual Activity  . Alcohol use: Yes    Comment: occas.  . Drug use: Never  . Sexual activity: Not Currently    Birth control/protection: None    Comment: 1st intercourse 40yo-Fewer than 5 partners  Other Topics Concern  . Not on file  Social History Narrative   Lives at home alone   Right handed   Caffeine: 1 cup/day   Social Determinants of Health   Financial Resource Strain: Not on file  Food Insecurity: Not on file  Transportation Needs: Not on file  Physical Activity: Not on file  Stress: Not on file  Social Connections: Not on file  Intimate Partner Violence: Not on file  PHYSICAL EXAM  Vitals:   11/07/20 1019  BP: 123/89  Pulse: 76  Weight: 190 lb 12.8 oz (86.5 kg)  Height: 5\' 2"  (1.575 m)   Body mass index is 34.9 kg/m.   Generalized: Well developed, in no acute distress  Cardiology: normal rate and rhythm, no murmur auscultated  Respiratory: clear to auscultation bilaterally    Neurological examination  Mentation: Alert oriented to time, place, history taking. Follows all commands speech and language fluent Cranial nerve II-XII: Pupils were equal round reactive to light. Extraocular movements were full, visual field were full on confrontational test. Facial sensation and strength were normal. Head turning and shoulder shrug  were normal and symmetric. Motor: The motor testing reveals 5 over 5 strength of all 4 extremities. Good symmetric motor tone is noted throughout.  Gait and station: Gait is normal.     DIAGNOSTIC DATA (LABS, IMAGING, TESTING) - I reviewed patient records, labs, notes, testing and imaging myself where available.  Lab Results  Component Value Date   WBC 7.1 07/02/2020   HGB 13.0 07/02/2020   HCT 39.6 07/02/2020   MCV 93 07/02/2020    PLT 324 07/02/2020      Component Value Date/Time   NA 143 07/02/2020 0819   K 3.8 07/02/2020 0819   CL 105 07/02/2020 0819   CO2 24 07/02/2020 0819   GLUCOSE 96 07/02/2020 0819   GLUCOSE 80 10/12/2019 1146   BUN 9 07/02/2020 0819   CREATININE 0.65 07/02/2020 0819   CALCIUM 9.1 07/02/2020 0819   PROT 7.1 07/02/2020 0819   ALBUMIN 4.3 07/02/2020 0819   AST 34 07/02/2020 0819   ALT 56 (H) 07/02/2020 0819   ALKPHOS 60 07/02/2020 0819   BILITOT <0.2 07/02/2020 0819   GFRNONAA 111 07/02/2020 0819   GFRAA 128 07/02/2020 0819   Lab Results  Component Value Date   CHOL 174 07/02/2020   HDL 48 07/02/2020   LDLCALC 116 (H) 07/02/2020   TRIG 49 07/02/2020   CHOLHDL 3.6 07/02/2020   Lab Results  Component Value Date   HGBA1C 5.5 07/02/2020   Lab Results  Component Value Date   VITAMINB12 1,174 07/02/2020   No results found for: TSH  No flowsheet data found.   ASSESSMENT AND PLAN  40 y.o. year old female  has a past medical history of Abnormal uterine bleeding (AUB), Complication of anesthesia, Difficult intravenous access, Endometriosis (2014), Hypertension, Infertility, female, Migraines, PONV (postoperative nausea and vomiting), Pre-diabetes, Skin abnormality, Sleep apnea, and Vitamin D deficiency. here with   Chronic migraine without aura without status migrainosus, not intractable  Nhung continues to have regular headaches with frequent migrainous symptoms.  She did tolerate Ajovy when last given in May.  Unfortunately, she had difficulty administering this medication at home.  I have provided another sample in the office today.  I have educated her on appropriate administration.  She administered medication to left lower abdomen today in the office.  I have provided her with educational material to take on as well.  We will continue sumatriptan as needed for abortive therapy.  I have encouraged her to avoid taking over-the-counter agents on a regular basis.  Healthy  lifestyle habits encouraged.  She will follow-up with me in 3 to 4 months.  She verbalizes understanding and agreement with this plan.  No orders of the defined types were placed in this encounter.     I spent 20 minutes of face-to-face and non-face-to-face time with patient.  This included previsit chart  review, lab review, study review, order entry, electronic health record documentation, patient education.    Shawnie Dapper, MSN, FNP-C 11/07/2020, 10:36 AM  [facility] [address] [place], [place] [address] [phone]  Made any corrections needed, and agree with history, physical, neuro exam,assessment and plan as stated.     Naomie Dean, MD [facility]

## 2020-11-06 NOTE — Patient Instructions (Incomplete)
Below is our plan:  We will restart Ajovy. Continue sumatriptan as needed for abortive therapy. Try to avoid regular use of OTC analgesics.   Please make sure you are staying well hydrated. I recommend 50-60 ounces daily. Well balanced diet and regular exercise encouraged.   Please continue follow up with care team as directed.   Follow up with [place] in 3-4 months.   You may receive a survey regarding today's visit. I encourage you to leave honest feed back as I do use this information to improve patient care. Thank you for seeing me today!    Fremanezumab injection What is this medicine? FREMANEZUMAB (fre ma NEZ ue mab) is used to prevent migraine headaches. This medicine may be used for other purposes; ask your health care provider or pharmacist if you have questions. COMMON BRAND NAME(S): AJOVY What should I tell my health care provider before I take this medicine? They need to know if you have any of these conditions:  an unusual or allergic reaction to fremanezumab, other medicines, foods, dyes, or preservatives  pregnant or trying to get pregnant  breast-feeding How should I use this medicine? This medicine is for injection under the skin. You will be taught how to prepare and give this medicine. Use exactly as directed. Take your medicine at regular intervals. Do not take your medicine more often than directed. It is important that you put your used needles and syringes in a special sharps container. Do not put them in a trash can. If you do not have a sharps container, call your pharmacist or healthcare provider to get one. Talk to your pediatrician regarding the use of this medicine in children. Special care may be needed. Overdosage: If you think you have taken too much of this medicine contact a poison control center or emergency room at once. NOTE: This medicine is only for you. Do not share this medicine with others. What if I miss a dose? If you miss a dose, take it as  soon as you can. If it is almost time for your next dose, take only that dose. Do not take double or extra doses. What may interact with this medicine? Interactions are not expected. This list may not describe all possible interactions. Give your health care provider a list of all the medicines, herbs, non-prescription drugs, or dietary supplements you use. Also tell them if you smoke, drink alcohol, or use illegal drugs. Some items may interact with your medicine. What should I watch for while using this medicine? Tell your doctor or healthcare professional if your symptoms do not start to get better or if they get worse. What side effects may I notice from receiving this medicine? Side effects that you should report to your doctor or health care professional as soon as possible:  allergic reactions like skin rash, itching or hives, swelling of the face, lips, or tongue Side effects that usually do not require medical attention (report these to your doctor or health care professional if they continue or are bothersome):  pain, redness, or irritation at site where injected This list may not describe all possible side effects. Call your doctor for medical advice about side effects. You may report side effects to FDA at 1-800-FDA-1088. Where should I keep my medicine? Keep out of the reach of children. You will be instructed on how to store this medicine. Throw away any unused medicine after the expiration date on the label. NOTE: This sheet is a summary. It may not  cover all possible information. If you have questions about this medicine, talk to your doctor, pharmacist, or health care provider.  2021 Elsevier/Gold Standard (2017-06-28 17:22:56)   Migraine Headache A migraine headache is a very strong throbbing pain on one side or both sides of your head. This type of headache can also cause other symptoms. It can last from 4 hours to 3 days. Talk with your doctor about what things may bring on  (trigger) this condition. What are the causes? The exact cause of this condition is not known. This condition may be triggered or caused by:  Drinking alcohol.  Smoking.  Taking medicines, such as: ? Medicine used to treat chest pain (nitroglycerin). ? Birth control pills. ? Estrogen. ? Some blood pressure medicines.  Eating or drinking certain products.  Doing physical activity. Other things that may trigger a migraine headache include:  Having a menstrual period.  Pregnancy.  Hunger.  Stress.  Not getting enough sleep or getting too much sleep.  Weather changes.  Tiredness (fatigue). What increases the risk?  Being 21-4 years old.  Being female.  Having a family history of migraine headaches.  Being Caucasian.  Having depression or anxiety.  Being very overweight. What are the signs or symptoms?  A throbbing pain. This pain may: ? Happen in any area of the head, such as on one side or both sides. ? Make it hard to do daily activities. ? Get worse with physical activity. ? Get worse around bright lights or loud noises.  Other symptoms may include: ? Feeling sick to your stomach (nauseous). ? Vomiting. ? Dizziness. ? Being sensitive to bright lights, loud noises, or smells.  Before you get a migraine headache, you may get warning signs (an aura). An aura may include: ? Seeing flashing lights or having blind spots. ? Seeing bright spots, halos, or zigzag lines. ? Having tunnel vision or blurred vision. ? Having numbness or a tingling feeling. ? Having trouble talking. ? Having weak muscles.  Some people have symptoms after a migraine headache (postdromal phase), such as: ? Tiredness. ? Trouble thinking (concentrating). How is this treated?  Taking medicines that: ? Relieve pain. ? Relieve the feeling of being sick to your stomach. ? Prevent migraine headaches.  Treatment may also include: ? Having acupuncture. ? Avoiding foods that bring  on migraine headaches. ? Learning ways to control your body functions (biofeedback). ? Therapy to help you know and deal with negative thoughts (cognitive behavioral therapy). Follow these instructions at home: Medicines  Take over-the-counter and prescription medicines only as told by your doctor.  Ask your doctor if the medicine prescribed to you: ? Requires you to avoid driving or using heavy machinery. ? Can cause trouble pooping (constipation). You may need to take these steps to prevent or treat trouble pooping:  Drink enough fluid to keep your pee (urine) pale yellow.  Take over-the-counter or prescription medicines.  Eat foods that are high in fiber. These include beans, whole grains, and fresh fruits and vegetables.  Limit foods that are high in fat and sugar. These include fried or sweet foods. Lifestyle  Do not drink alcohol.  Do not use any products that contain nicotine or tobacco, such as cigarettes, e-cigarettes, and chewing tobacco. If you need help quitting, ask your doctor.  Get at least 8 hours of sleep every night.  Limit and deal with stress. General instructions  Keep a journal to find out what may bring on your migraine headaches. For example,  write down: ? What you eat and drink. ? How much sleep you get. ? Any change in what you eat or drink. ? Any change in your medicines.  If you have a migraine headache: ? Avoid things that make your symptoms worse, such as bright lights. ? It may help to lie down in a dark, quiet room. ? Do not drive or use heavy machinery. ? Ask your doctor what activities are safe for you.  Keep all follow-up visits as told by your doctor. This is important.      Contact a doctor if:  You get a migraine headache that is different or worse than others you have had.  You have more than 15 headache days in one month. Get help right away if:  Your migraine headache gets very bad.  Your migraine headache lasts longer  than 72 hours.  You have a fever.  You have a stiff neck.  You have trouble seeing.  Your muscles feel weak or like you cannot control them.  You start to lose your balance a lot.  You start to have trouble walking.  You pass out (faint).  You have a seizure. Summary  A migraine headache is a very strong throbbing pain on one side or both sides of your head. These headaches can also cause other symptoms.  This condition may be treated with medicines and changes to your lifestyle.  Keep a journal to find out what may bring on your migraine headaches.  Contact a doctor if you get a migraine headache that is different or worse than others you have had.  Contact your doctor if you have more than 15 headache days in a month. This information is not intended to replace advice given to you by your health care provider. Make sure you discuss any questions you have with your health care provider. Document Revised: 01/20/2019 Document Reviewed: 11/10/2018 Elsevier Patient Education  2021 [employer].

## 2020-11-07 ENCOUNTER — Encounter: Payer: Self-pay | Admitting: Family Medicine

## 2020-11-07 ENCOUNTER — Other Ambulatory Visit: Payer: Self-pay | Admitting: Family Medicine

## 2020-11-07 ENCOUNTER — Telehealth: Payer: Self-pay | Admitting: *Deleted

## 2020-11-07 ENCOUNTER — Encounter: Payer: Self-pay | Admitting: *Deleted

## 2020-11-07 ENCOUNTER — Ambulatory Visit: Payer: BLUE CROSS/BLUE SHIELD | Admitting: Family Medicine

## 2020-11-07 VITALS — BP 123/89 | HR 76 | Ht 62.0 in | Wt 190.8 lb

## 2020-11-07 DIAGNOSIS — G43709 Chronic migraine without aura, not intractable, without status migrainosus: Secondary | ICD-10-CM

## 2020-11-07 MED ORDER — AJOVY 225 MG/1.5ML ~~LOC~~ SOAJ: 225.0000 mg | SUBCUTANEOUS | 0 refills | Status: DC

## 2020-11-07 MED ORDER — SUMATRIPTAN SUCCINATE 100 MG PO TABS: 100.0000 mg | ORAL_TABLET | Freq: Once | ORAL | 12 refills | Status: AC | PRN

## 2020-11-07 MED ORDER — AJOVY 225 MG/1.5ML ~~LOC~~ SOAJ: 225.0000 mg | SUBCUTANEOUS | 3 refills | Status: DC

## 2020-11-07 NOTE — Telephone Encounter (Signed)
Ajovy PA, key: [id number], [id number]. [employer] is processing your PA request and will respond shortly with next steps.  If you need assistance, please chat with CoverMyMeds or call us at [phone]. Answered additional clinical questions on CMM.  PA Case: [id number], Status: Approved, Coverage Starts on: 11/07/2020 12:00:00 AM, Coverage Ends on: 11/07/2021 12:00:00 AM Notified patient via my chart.

## 2020-11-08 ENCOUNTER — Other Ambulatory Visit (INDEPENDENT_AMBULATORY_CARE_PROVIDER_SITE_OTHER): Payer: Self-pay | Admitting: Adult Health

## 2020-11-08 DIAGNOSIS — R7303 Prediabetes: Secondary | ICD-10-CM

## 2020-11-19 ENCOUNTER — Ambulatory Visit (INDEPENDENT_AMBULATORY_CARE_PROVIDER_SITE_OTHER): Payer: BLUE CROSS/BLUE SHIELD | Admitting: Adult Health

## 2020-11-19 ENCOUNTER — Other Ambulatory Visit: Payer: Self-pay

## 2020-11-19 ENCOUNTER — Encounter (INDEPENDENT_AMBULATORY_CARE_PROVIDER_SITE_OTHER): Payer: Self-pay | Admitting: Adult Health

## 2020-11-19 VITALS — BP 113/80 | HR 71 | Temp 98.1°F | Ht 62.0 in | Wt 184.0 lb

## 2020-11-19 DIAGNOSIS — Z6833 Body mass index (BMI) 33.0-33.9, adult: Secondary | ICD-10-CM

## 2020-11-19 DIAGNOSIS — G43109 Migraine with aura, not intractable, without status migrainosus: Secondary | ICD-10-CM | POA: Diagnosis not present

## 2020-11-19 DIAGNOSIS — R7303 Prediabetes: Secondary | ICD-10-CM

## 2020-11-19 DIAGNOSIS — E669 Obesity, unspecified: Secondary | ICD-10-CM | POA: Diagnosis not present

## 2020-11-19 DIAGNOSIS — I10 Essential (primary) hypertension: Secondary | ICD-10-CM | POA: Diagnosis not present

## 2020-11-20 NOTE — Progress Notes (Signed)
Chief Complaint:   OBESITY Angelica Young is here to discuss her progress with her obesity treatment plan along with follow-up of her obesity related diagnoses. Angelica Young is on keeping a food journal and adhering to recommended goals of 1100-1200 calories and 85 g protein and states she is following her eating plan approximately 90% of the time. Angelica Young states she is doing cardio 45 minutes 4 times per week.  Today's visit was #: 15 Starting weight: 182 lbs Starting date: 12/21/2019 Today's weight: 184 lbs Today's date: 11/19/2020 Total lbs lost to date: 0 Total lbs lost since last in-office visit: 4 lbs  Interim History: Angelica Young completed a "faith fast" which required complete elimination of caffeine, sugar, ETOH for 21 days. She completed it on January 31. She reports decreased joint stiffness during the fast.  Interval goal: Limit sugary foods/added sugar to only 2 days a week (Sat/Sun) and increase daily water intake.  Subjective:   1. Pre-diabetes Pt is still experiencing occasional abdominal cramping with Metformin dose when taken with a breakfast of oatmeal. She denies nausea or diarrhea. Metformin XR was recently decreased from 750 mg to 500 mg daily with breakfast.  2. Essential hypertension BP and heart rate excellent in OV today. She is on Verapamil 180 mg at bedtime.   BP Readings from Last 3 Encounters:  11/19/20 113/80  11/07/20 123/89  10/17/20 115/81    3. Migraine with aura and without status migrainosus, not intractable Pt takes monthly Ajovy injections managed by neurology. She reports greater than 80% reduction of migraine occurrence with Ajovy therapy.   Assessment/Plan:   1. Pre-diabetes Angelica Young will continue to work on weight loss, exercise, and decreasing simple carbohydrates to help decrease the risk of diabetes. Have chewable protein with breakfast and Metformin dose. Will monitor medication side effects.  2. Essential hypertension Angelica Young is working on  healthy weight loss and exercise to improve blood pressure control. We will watch for signs of hypotension as she continues her lifestyle modifications. Continue Verapamil and journaling program.  3. Migraine with aura and without status migrainosus, not intractable Continue Ajovy and follow up with neurology as directed.  4. Class 1 obesity with serious comorbidity and body mass index (BMI) of 33.0 to 33.9 in adult, unspecified obesity type Angelica Young is currently in the action stage of change. As such, her goal is to continue with weight loss efforts. She has agreed to keeping a food journal and adhering to recommended goals of 1100-1200 calories and 85 g protein.   Fasting labs at next OV.  Exercise goals: As is  Behavioral modification strategies: increasing lean protein intake, decreasing simple carbohydrates, meal planning and cooking strategies, planning for success and keeping a strict food journal.  Angelica Young has agreed to follow-up with our clinic in 2 weeks. She was informed of the importance of frequent follow-up visits to maximize her success with intensive lifestyle modifications for her multiple health conditions.   Objective:   Blood pressure 113/80, pulse 71, temperature 98.1 F (36.7 C), height 5\' 2"  (1.575 m), weight 184 lb (83.5 kg), SpO2 97 %. Body mass index is 33.65 kg/m.  General: Cooperative, alert, well developed, in no acute distress. HEENT: Conjunctivae and lids unremarkable. Cardiovascular: Regular rhythm.  Lungs: Normal work of breathing. Neurologic: No focal deficits.   Lab Results  Component Value Date   CREATININE 0.65 07/02/2020   BUN 9 07/02/2020   NA 143 07/02/2020   K 3.8 07/02/2020   CL 105 07/02/2020   CO2  24 07/02/2020   Lab Results  Component Value Date   ALT 56 (H) 07/02/2020   AST 34 07/02/2020   ALKPHOS 60 07/02/2020   BILITOT <0.2 07/02/2020   Lab Results  Component Value Date   HGBA1C 5.5 07/02/2020   HGBA1C 5.6 12/26/2019    Lab Results  Component Value Date   INSULIN 13.4 12/26/2019   No results found for: TSH Lab Results  Component Value Date   CHOL 174 07/02/2020   HDL 48 07/02/2020   LDLCALC 116 (H) 07/02/2020   TRIG 49 07/02/2020   CHOLHDL 3.6 07/02/2020   Lab Results  Component Value Date   WBC 7.1 07/02/2020   HGB 13.0 07/02/2020   HCT 39.6 07/02/2020   MCV 93 07/02/2020   PLT 324 07/02/2020   Lab Results  Component Value Date   IRON 77 07/02/2020   TIBC 368 07/02/2020   FERRITIN 22 07/02/2020    Attestation Statements:   Reviewed by clinician on day of visit: allergies, medications, problem list, medical history, surgical history, family history, social history, and previous encounter notes.  Time spent on visit including pre-visit chart review and post-visit care and charting was 35 minutes.   Edmund Hilda, am acting as Energy manager for William Hamburger, NP.  I have reviewed the above documentation for accuracy and completeness, and I agree with the above. -  Ciclaly Mulcahey d. Avelino Herren, NP-C

## 2020-11-21 ENCOUNTER — Other Ambulatory Visit: Payer: Self-pay | Admitting: Internal Medicine

## 2020-11-21 DIAGNOSIS — F3289 Other specified depressive episodes: Secondary | ICD-10-CM

## 2020-12-01 ENCOUNTER — Other Ambulatory Visit (INDEPENDENT_AMBULATORY_CARE_PROVIDER_SITE_OTHER): Payer: Self-pay | Admitting: Adult Health

## 2020-12-01 DIAGNOSIS — R7303 Prediabetes: Secondary | ICD-10-CM

## 2020-12-02 DIAGNOSIS — I10 Essential (primary) hypertension: Secondary | ICD-10-CM | POA: Diagnosis not present

## 2020-12-02 DIAGNOSIS — F3341 Major depressive disorder, recurrent, in partial remission: Secondary | ICD-10-CM | POA: Diagnosis not present

## 2020-12-02 DIAGNOSIS — R7303 Prediabetes: Secondary | ICD-10-CM | POA: Diagnosis not present

## 2020-12-10 ENCOUNTER — Encounter (INDEPENDENT_AMBULATORY_CARE_PROVIDER_SITE_OTHER): Payer: Self-pay | Admitting: Adult Health

## 2020-12-10 ENCOUNTER — Ambulatory Visit (INDEPENDENT_AMBULATORY_CARE_PROVIDER_SITE_OTHER): Payer: BLUE CROSS/BLUE SHIELD | Admitting: Adult Health

## 2020-12-10 ENCOUNTER — Other Ambulatory Visit: Payer: Self-pay

## 2020-12-10 VITALS — BP 127/80 | HR 80 | Temp 98.3°F | Ht 62.0 in | Wt 184.0 lb

## 2020-12-10 DIAGNOSIS — I10 Essential (primary) hypertension: Secondary | ICD-10-CM | POA: Diagnosis not present

## 2020-12-10 DIAGNOSIS — Z9189 Other specified personal risk factors, not elsewhere classified: Secondary | ICD-10-CM

## 2020-12-10 DIAGNOSIS — E7800 Pure hypercholesterolemia, unspecified: Secondary | ICD-10-CM | POA: Diagnosis not present

## 2020-12-10 DIAGNOSIS — Z862 Personal history of diseases of the blood and blood-forming organs and certain disorders involving the immune mechanism: Secondary | ICD-10-CM

## 2020-12-10 DIAGNOSIS — R7303 Prediabetes: Secondary | ICD-10-CM | POA: Diagnosis not present

## 2020-12-10 DIAGNOSIS — E559 Vitamin D deficiency, unspecified: Secondary | ICD-10-CM

## 2020-12-10 DIAGNOSIS — E669 Obesity, unspecified: Secondary | ICD-10-CM

## 2020-12-10 DIAGNOSIS — Z6834 Body mass index (BMI) 34.0-34.9, adult: Secondary | ICD-10-CM

## 2020-12-11 LAB — LIPID PANEL
Chol/HDL Ratio: 3.2 ratio (ref 0.0–4.4)
Cholesterol, Total: 199 mg/dL (ref 100–199)
HDL: 62 mg/dL (ref >39)
LDL Chol Calc (NIH): 128 mg/dL — ABNORMAL HIGH (ref 0–99)
Triglycerides: 50 mg/dL (ref 0–149)
VLDL Cholesterol Cal: 9 mg/dL (ref 5–40)

## 2020-12-11 LAB — COMPREHENSIVE METABOLIC PANEL
ALT: 13 IU/L (ref 0–32)
AST: 18 IU/L (ref 0–40)
Albumin/Globulin Ratio: 1.4 (ref 1.2–2.2)
Albumin: 4.3 g/dL (ref 3.8–4.8)
Alkaline Phosphatase: 66 IU/L (ref 44–121)
BUN/Creatinine Ratio: 12 (ref 9–23)
BUN: 8 mg/dL (ref 6–24)
Bilirubin Total: 0.3 mg/dL (ref 0.0–1.2)
CO2: 25 mmol/L (ref 20–29)
Calcium: 9.3 mg/dL (ref 8.7–10.2)
Chloride: 97 mmol/L (ref 96–106)
Creatinine, Ser: 0.69 mg/dL (ref 0.57–1.00)
Globulin, Total: 3 g/dL (ref 1.5–4.5)
Glucose: 89 mg/dL (ref 65–99)
Potassium: 4.1 mmol/L (ref 3.5–5.2)
Sodium: 140 mmol/L (ref 134–144)
Total Protein: 7.3 g/dL (ref 6.0–8.5)
eGFR: 111 mL/min/{1.73_m2} (ref >59)

## 2020-12-11 LAB — CBC
Hematocrit: 40.5 % (ref 34.0–46.6)
Hemoglobin: 13.7 g/dL (ref 11.1–15.9)
MCH: 31.4 pg (ref 26.6–33.0)
MCHC: 33.8 g/dL (ref 31.5–35.7)
MCV: 93 fL (ref 79–97)
Platelets: 328 10*3/uL (ref 150–450)
RBC: 4.36 x10E6/uL (ref 3.77–5.28)
RDW: 14.3 % (ref 11.7–15.4)
WBC: 7.7 10*3/uL (ref 3.4–10.8)

## 2020-12-11 LAB — HEMOGLOBIN A1C
Est. average glucose Bld gHb Est-mCnc: 108 mg/dL
Hgb A1c MFr Bld: 5.4 % (ref 4.8–5.6)

## 2020-12-11 LAB — VITAMIN D 25 HYDROXY (VIT D DEFICIENCY, FRACTURES): Vit D, 25-Hydroxy: 51.2 ng/mL (ref 30.0–100.0)

## 2020-12-11 LAB — INSULIN, RANDOM: INSULIN: 8.8 u[IU]/mL (ref 2.6–24.9)

## 2020-12-11 NOTE — Progress Notes (Signed)
Chief Complaint:   OBESITY Angelica Young is here to discuss her progress with her obesity treatment plan along with follow-up of her obesity related diagnoses. Angelica Young is on keeping a food journal and adhering to recommended goals of 1100-1200 calories and 85 g protein and states she is following her eating plan approximately 50% of the time. Angelica Young states she is cardio 30 minutes 4 times per week.  Today's visit was #: 16 Starting weight: 182 lbs Starting date: 12/21/2019 Today's weight: 188 lbs Today's date: 12/10/2020 Total lbs lost to date: 0 Total lbs lost since last in-office visit: 0  Interim History: Angelica Young took a new position in [place] and will begin at [employer] end. She has not been tracking intake the last week. When tracking, she will consume 50-65 g protein per day and has experienced occasional cravings. Metformin XR was decreased from 750 mg to 500 mg early January due to nausea and dizziness. These symptoms have since resolved with dose reduction.   Subjective:   1. Essential hypertension Pt's BP and heart rate are at goal at OV. She is on Verapamil 180 mg daily and [place] 25 mg daily.  BP Readings from Last 3 Encounters:  12/10/20 127/80  11/19/20 113/80  11/07/20 123/89    2. Pre-diabetes Pt is on Metformin XR 500 mg with breakfast. Metformin dose was decreased from 750 mg to 500 mg in early January 2022 due to nausea and dizziness. Pt reports she is still experiencing polyphagia. Most days she is consuming 50-65 g protein/day.   3. Vitamin D deficiency Angelica Young's Vitamin D level was 54.3 on 07/02/2020. She is currently taking OTC vitamin D 5,000 units each day. She denies nausea, vomiting or muscle weakness.   Ref. Range 07/02/2020 08:19  Vitamin D, 25-Hydroxy Latest Ref Range: 30.0 - 100.0 ng/mL 54.3   4. Pure hypercholesterolemia Pt's 07/02/2020 lipid panel resulted LDL slightly above goal at 116.  5. History of anemia Pt is on OTC multivitamin and OTC vegan  liquid iron supplement 18 mg per serving.  6. At risk for diabetes mellitus Angelica Young is at higher than average risk for developing diabetes due to obesity.   Assessment/Plan:   1. Essential hypertension Angelica Young is working on healthy weight loss and exercise to improve blood pressure control. We will watch for signs of hypotension as she continues her lifestyle modifications. Check labs today.  - Comprehensive metabolic panel  2. Pre-diabetes Angelica Young will continue to work on weight loss, exercise, and decreasing simple carbohydrates to help decrease the risk of diabetes. Check labs today.  - Hemoglobin A1c - Insulin, random  3. Vitamin D deficiency Low Vitamin D level contributes to fatigue and are associated with obesity, breast, and colon cancer. She agrees to continue to take OTC Vitamin D @5 ,000 IU daily and will follow-up for routine testing of Vitamin D, at least 2-3 times per year to avoid over-replacement. Check labs today.  - VITAMIN D 25 Hydroxy (Vit-D Deficiency, Fractures)  4. Pure hypercholesterolemia Cardiovascular risk and specific lipid/LDL goals reviewed.  We discussed several lifestyle modifications today and Angelica Young will continue to work on diet, exercise and weight loss efforts. Orders and follow up as documented in patient record. Check labs today.  Counseling Intensive lifestyle modifications are the first line treatment for this issue. . Dietary changes: Increase soluble fiber. Decrease simple carbohydrates. . Exercise changes: Moderate to vigorous-intensity aerobic activity 150 minutes per week if tolerated. . Lipid-lowering medications: see documented in medical record.  - Lipid  panel  5. History of anemia Continue OTC supplements. Check labs today.  - CBC  6. At risk for diabetes mellitus Scottie was given approximately 15 minutes of diabetes education and counseling today. We discussed intensive lifestyle modifications today with an emphasis on weight  loss as well as increasing exercise and decreasing simple carbohydrates in her diet. We also reviewed medication options with an emphasis on risk versus benefit of those discussed.   Repetitive spaced learning was employed today to elicit superior memory formation and behavioral change.  7. Class 1 obesity with serious comorbidity and body mass index (BMI) of 34.0 to 34.9 in adult, unspecified obesity type Larkin is currently in the action stage of change. As such, her goal is to continue with weight loss efforts. She has agreed to keeping a food journal and adhering to recommended goals of 1100-1200 calories and 85 g protein.   Exercise goals: As is  Behavioral modification strategies: increasing lean protein intake, decreasing simple carbohydrates, meal planning and cooking strategies, planning for success and keeping a strict food journal.  Lyn has agreed to follow-up with our clinic in 2 weeks. She was informed of the importance of frequent follow-up visits to maximize her success with intensive lifestyle modifications for her multiple health conditions.   Corey was informed we would discuss her lab results at her next visit unless there is a critical issue that needs to be addressed sooner. Miel agreed to keep her next visit at the agreed upon time to discuss these results.  Objective:   Blood pressure 127/80, pulse 80, temperature 98.3 F (36.8 C), height 5\' 2"  (1.575 m), weight 184 lb (83.5 kg), SpO2 97 %. Body mass index is 33.65 kg/m.  General: Cooperative, alert, well developed, in no acute distress. HEENT: Conjunctivae and lids unremarkable. Cardiovascular: Regular rhythm.  Lungs: Normal work of breathing. Neurologic: No focal deficits.   Lab Results  Component Value Date   CREATININE 0.69 12/10/2020   BUN 8 12/10/2020   NA 140 12/10/2020   K 4.1 12/10/2020   CL 97 12/10/2020   CO2 25 12/10/2020   Lab Results  Component Value Date   ALT 13 12/10/2020   AST  18 12/10/2020   ALKPHOS 66 12/10/2020   BILITOT 0.3 12/10/2020   Lab Results  Component Value Date   HGBA1C 5.4 12/10/2020   HGBA1C 5.5 07/02/2020   HGBA1C 5.6 12/26/2019   Lab Results  Component Value Date   INSULIN 8.8 12/10/2020   INSULIN 13.4 12/26/2019   No results found for: TSH Lab Results  Component Value Date   CHOL 199 12/10/2020   HDL 62 12/10/2020   LDLCALC 128 (H) 12/10/2020   TRIG 50 12/10/2020   CHOLHDL 3.2 12/10/2020   Lab Results  Component Value Date   WBC 7.7 12/10/2020   HGB 13.7 12/10/2020   HCT 40.5 12/10/2020   MCV 93 12/10/2020   PLT 328 12/10/2020   Lab Results  Component Value Date   IRON 77 07/02/2020   TIBC 368 07/02/2020   FERRITIN 22 07/02/2020    Attestation Statements:   Reviewed by clinician on day of visit: allergies, medications, problem list, medical history, surgical history, family history, social history, and previous encounter notes.  07/04/2020, am acting as Edmund Hilda for Energy manager, NP.  I have reviewed the above documentation for accuracy and completeness, and I agree with the above. -  Rylin Seavey d. Blair Lundeen, NP-C

## 2020-12-12 DIAGNOSIS — L732 Hidradenitis suppurativa: Secondary | ICD-10-CM | POA: Diagnosis not present

## 2020-12-12 DIAGNOSIS — L700 Acne vulgaris: Secondary | ICD-10-CM | POA: Diagnosis not present

## 2020-12-13 ENCOUNTER — Other Ambulatory Visit: Payer: Self-pay | Admitting: Internal Medicine

## 2020-12-13 DIAGNOSIS — F3289 Other specified depressive episodes: Secondary | ICD-10-CM

## 2020-12-16 ENCOUNTER — Encounter (INDEPENDENT_AMBULATORY_CARE_PROVIDER_SITE_OTHER): Payer: Self-pay | Admitting: Adult Health

## 2020-12-17 ENCOUNTER — Other Ambulatory Visit (INDEPENDENT_AMBULATORY_CARE_PROVIDER_SITE_OTHER): Payer: Self-pay | Admitting: Adult Health

## 2020-12-17 DIAGNOSIS — R7303 Prediabetes: Secondary | ICD-10-CM

## 2020-12-17 MED ORDER — METFORMIN HCL ER 500 MG PO TB24: 500.0000 mg | ORAL_TABLET | Freq: Every day | ORAL | 0 refills | Status: DC

## 2020-12-17 NOTE — Telephone Encounter (Signed)
Refill request

## 2020-12-19 ENCOUNTER — Encounter: Payer: Self-pay | Admitting: Nurse Practitioner

## 2020-12-19 ENCOUNTER — Other Ambulatory Visit: Payer: Self-pay

## 2020-12-19 ENCOUNTER — Encounter: Payer: BLUE CROSS/BLUE SHIELD | Admitting: Obstetrics and Gynecology

## 2020-12-19 ENCOUNTER — Other Ambulatory Visit: Payer: Self-pay | Admitting: Nurse Practitioner

## 2020-12-19 ENCOUNTER — Ambulatory Visit (INDEPENDENT_AMBULATORY_CARE_PROVIDER_SITE_OTHER): Payer: BLUE CROSS/BLUE SHIELD | Admitting: Nurse Practitioner

## 2020-12-19 VITALS — BP 128/86 | Ht 62.0 in | Wt 190.0 lb

## 2020-12-19 DIAGNOSIS — Z1231 Encounter for screening mammogram for malignant neoplasm of breast: Secondary | ICD-10-CM

## 2020-12-19 DIAGNOSIS — N809 Endometriosis, unspecified: Secondary | ICD-10-CM | POA: Diagnosis not present

## 2020-12-19 DIAGNOSIS — Z01419 Encounter for gynecological examination (general) (routine) without abnormal findings: Secondary | ICD-10-CM | POA: Diagnosis not present

## 2020-12-19 DIAGNOSIS — Z30431 Encounter for routine checking of intrauterine contraceptive device: Secondary | ICD-10-CM

## 2020-12-19 NOTE — Progress Notes (Signed)
   Angelica Young [birth year] [id number]   History:  40 y.o. G2P0020 presents for annual exam. Mirena IUD inserted 06/2020. She has had cramping since insertion and it was thought that maybe Metformin was causing some GI cramping. Dose has been lowered with some improvement. She is being follow at weight loss clinic and A1C has improved to normal range, so she is hoping to stop Metformin and see if cramping improves. 06/2020 benign polypectomy. History of endometriosis first diagnosed in 2014, laparoscopic procedure x 4. Has tried Lupron in the past but could not tolerate. Normal pap and mammogram history.   Gynecologic History No LMP recorded. (Menstrual status: IUD).   Contraception/Family planning: IUD  Health Maintenance Last Pap: 11/28/2019. Results were: normal Last mammogram: 2 years ago per patient. Results were: normal Last colonoscopy: N/A Last Dexa: N/A  Past medical history, past surgical history, family history and social history were all reviewed and documented in the EPIC chart.  ROS:  A ROS was performed and pertinent positives and negatives are included.  Exam:  Vitals:   12/19/20 1418  BP: 128/86  Weight: 190 lb (86.2 kg)  Height: 5\' 2"  (1.575 m)   Body mass index is 34.75 kg/m.  General appearance:  Normal Thyroid:  Symmetrical, normal in size, without palpable masses or nodularity. Respiratory  Auscultation:  Clear without wheezing or rhonchi Cardiovascular  Auscultation:  Regular rate, without rubs, murmurs or gallops  Edema/varicosities:  Not grossly evident Abdominal  Soft,nontender, without masses, guarding or rebound.  Liver/spleen:  No organomegaly noted  Hernia:  None appreciated  Skin  Inspection:  Grossly normal   Breasts: Examined lying and sitting.   Right: Without masses, retractions, discharge or axillary adenopathy.   Left: Without masses, retractions, discharge or axillary adenopathy. Gentitourinary   Inguinal/mons:  Normal without  inguinal adenopathy  External genitalia:  Normal  BUS/Urethra/Skene's glands:  Normal  Vagina:  Normal  Cervix:  Normal, IUD string visualized at exocervix   Uterus:  Normal in size, shape and contour.  Midline and mobile  Adnexa/parametria:     Rt: Without masses or tenderness.   Lt: Without masses or tenderness.  Anus and perineum: Normal  Assessment/Plan:  40 y.o. G2P0020 for annual exam.   Well female exam with routine gynecological exam - Education provided on SBEs, importance of preventative screenings, current guidelines, high calcium diet, regular exercise, and multivitamin daily. Labs with PCP.   Encounter for routine checking of intrauterine contraceptive device (IUD) - Mirena inserted 06/2020. String visible at exocervix.  Minimal spotting after first few months. She does experience frequent cramping.   Endometriosis - History of endometriosis first diagnosed in 2014, laparoscopic procedure x 4. Has tried Lupron in the past but could not tolerate. She is doing well with Mirena.   Screening for cervical cancer - Normal Pap history.  Will repeat at 3-year interval per guidelines.  Screening for breast cancer - Normal mammogram history.  She is overdue for mammogram. We discussed current guidelines and importance of preventative screenings. Information provided on [facility]. Normal breast exam today.  Return in 1 year for annual.       2015 [facility], 2:37 PM 12/19/2020

## 2020-12-19 NOTE — Patient Instructions (Addendum)
Schedule mammogram! [facility] [phone] [address]  , [place] [address]   Health Maintenance, Female Adopting a healthy lifestyle and getting preventive care are important in promoting health and wellness. Ask your health care provider about:  The right schedule for you to have regular tests and exams.  Things you can do on your own to prevent diseases and keep yourself healthy. What should I know about diet, weight, and exercise? Eat a healthy diet  Eat a diet that includes plenty of vegetables, fruits, low-fat dairy products, and lean protein.  Do not eat a lot of foods that are high in solid fats, added sugars, or sodium.   Maintain a healthy weight Body mass index (BMI) is used to identify weight problems. It estimates body fat based on height and weight. Your health care provider can help determine your BMI and help you achieve or maintain a healthy weight. Get regular exercise Get regular exercise. This is one of the most important things you can do for your health. Most adults should:  Exercise for at least 150 minutes each week. The exercise should increase your heart rate and make you sweat (moderate-intensity exercise).  Do strengthening exercises at least twice a week. This is in addition to the moderate-intensity exercise.  Spend less time sitting. Even light physical activity can be beneficial. Watch cholesterol and blood lipids Have your blood tested for lipids and cholesterol at 42 years of age, then have this test every 5 years. Have your cholesterol levels checked more often if:  Your lipid or cholesterol levels are high.  You are older than 42 years of age.  You are at high risk for heart disease. What should I know about cancer screening? Depending on your health history and family history, you may need to have cancer screening at various ages. This may include screening for:  Breast cancer.  Cervical  cancer.  Colorectal cancer.  Skin cancer.  Lung cancer. What should I know about heart disease, diabetes, and high blood pressure? Blood pressure and heart disease  High blood pressure causes heart disease and increases the risk of stroke. This is more likely to develop in people who have high blood pressure readings, are of African descent, or are overweight.  Have your blood pressure checked: ? Every 3-5 years if you are 18-39 years of age. ? Every year if you are 40 years old or older. Diabetes Have regular diabetes screenings. This checks your fasting blood sugar level. Have the screening done:  Once every three years after age 40 if you are at a normal weight and have a low risk for diabetes.  More often and at a younger age if you are overweight or have a high risk for diabetes. What should I know about preventing infection? Hepatitis B If you have a higher risk for hepatitis B, you should be screened for this virus. Talk with your health care provider to find out if you are at risk for hepatitis B infection. Hepatitis C Testing is recommended for:  Everyone born from 1945 through 1965.  Anyone with known risk factors for hepatitis C. Sexually transmitted infections (STIs)  Get screened for STIs, including gonorrhea and chlamydia, if: ? You are sexually active and are younger than 42 years of age. ? You are older than 42 years of age and your health care provider tells you that you are at risk for this type of infection. ? Your sexual activity has changed   since you were last screened, and you are at increased risk for chlamydia or gonorrhea. Ask your health care provider if you are at risk.  Ask your health care provider about whether you are at high risk for HIV. Your health care provider may recommend a prescription medicine to help prevent HIV infection. If you choose to take medicine to prevent HIV, you should first get tested for HIV. You should then be tested every 3  months for as long as you are taking the medicine. Pregnancy  If you are about to stop having your period (premenopausal) and you may become pregnant, seek counseling before you get pregnant.  Take 400 to 800 micrograms (mcg) of folic acid every day if you become pregnant.  Ask for birth control (contraception) if you want to prevent pregnancy. Osteoporosis and menopause Osteoporosis is a disease in which the bones lose minerals and strength with aging. This can result in bone fractures. If you are 65 years old or older, or if you are at risk for osteoporosis and fractures, ask your health care provider if you should:  Be screened for bone loss.  Take a calcium or vitamin D supplement to lower your risk of fractures.  Be given hormone replacement therapy (HRT) to treat symptoms of menopause. Follow these instructions at home: Lifestyle  Do not use any products that contain nicotine or tobacco, such as cigarettes, e-cigarettes, and chewing tobacco. If you need help quitting, ask your health care provider.  Do not use street drugs.  Do not share needles.  Ask your health care provider for help if you need support or information about quitting drugs. Alcohol use  Do not drink alcohol if: ? Your health care provider tells you not to drink. ? You are pregnant, may be pregnant, or are planning to become pregnant.  If you drink alcohol: ? Limit how much you use to 0-1 drink a day. ? Limit intake if you are breastfeeding.  Be aware of how much alcohol is in your drink. In the [place] one drink equals one 12 oz bottle of beer (355 mL), one 5 oz glass of wine (148 mL), or one 1 oz glass of hard liquor (44 mL). General instructions  Schedule regular health, dental, and eye exams.  Stay current with your vaccines.  Tell your health care provider if: ? You often feel depressed. ? You have ever been abused or do not feel safe at home. Summary  Adopting a healthy lifestyle and getting  preventive care are important in promoting health and wellness.  Follow your health care provider's instructions about healthy diet, exercising, and getting tested or screened for diseases.  Follow your health care provider's instructions on monitoring your cholesterol and blood pressure. This information is not intended to replace advice given to you by your health care provider. Make sure you discuss any questions you have with your health care provider. Document Revised: 09/21/2018 Document Reviewed: 09/21/2018 Elsevier Patient Education  2021 [employer]  

## 2020-12-23 ENCOUNTER — Other Ambulatory Visit: Payer: Self-pay

## 2020-12-23 ENCOUNTER — Ambulatory Visit (INDEPENDENT_AMBULATORY_CARE_PROVIDER_SITE_OTHER): Payer: BLUE CROSS/BLUE SHIELD | Admitting: Family Medicine

## 2020-12-23 ENCOUNTER — Encounter (INDEPENDENT_AMBULATORY_CARE_PROVIDER_SITE_OTHER): Payer: Self-pay | Admitting: Family Medicine

## 2020-12-23 VITALS — BP 120/83 | HR 72 | Temp 98.5°F | Ht 62.0 in | Wt 188.0 lb

## 2020-12-23 DIAGNOSIS — E559 Vitamin D deficiency, unspecified: Secondary | ICD-10-CM | POA: Diagnosis not present

## 2020-12-23 DIAGNOSIS — E7800 Pure hypercholesterolemia, unspecified: Secondary | ICD-10-CM | POA: Diagnosis not present

## 2020-12-23 DIAGNOSIS — E669 Obesity, unspecified: Secondary | ICD-10-CM

## 2020-12-23 DIAGNOSIS — Z6834 Body mass index (BMI) 34.0-34.9, adult: Secondary | ICD-10-CM | POA: Diagnosis not present

## 2020-12-23 MED ORDER — VITAMIN D 125 MCG (5000 UT) PO CAPS: 1.0000 | ORAL_CAPSULE | Freq: Every day | ORAL | 0 refills | Status: AC

## 2020-12-23 NOTE — Progress Notes (Signed)
The 10-year ASCVD risk score Denman George DC Montez Hageman., et al., 2013) is: 1%   Values used to calculate the score:     Age: 40 years     Sex: Female     Is Non-Hispanic African American: Yes     Diabetic: No     Tobacco smoker: No     Systolic Blood Pressure: 120 mmHg     Is BP treated: Yes     HDL Cholesterol: 62 mg/dL     Total Cholesterol: 199 mg/dL

## 2020-12-24 ENCOUNTER — Ambulatory Visit (INDEPENDENT_AMBULATORY_CARE_PROVIDER_SITE_OTHER): Payer: BLUE CROSS/BLUE SHIELD | Admitting: Family Medicine

## 2020-12-24 NOTE — Progress Notes (Signed)
Chief Complaint:   OBESITY Angelica Young is here to discuss her progress with her obesity treatment plan along with follow-up of her obesity related diagnoses. Angelica Young is on keeping a food journal and adhering to recommended goals of 1100-1200 calories and 85 g protein and states she is following her eating plan approximately 50% of the time. Angelica Young states she is doing cardio 30-40 minutes 4 times per week.  Today's visit was #: 17 Starting weight: 182 lbs Starting date: 12/21/2019 Today's weight: 188 lbs Today's date: 12/23/2020 Total lbs lost to date: 0 Total lbs lost since last in-office visit: 0  Interim History: Angelica Young has been journaling sporadically due to stress related to job change. She struggles to get protein in. She would like to stick to journaling at this point. Her water intake has been down (48 oz/day). She feels category 2 has too much sodium.  Subjective:   1. Vitamin D deficiency Angelica Young's Vit D level is within normal limits. She is on OTC 5000 IU daily.   Ref. Range 12/10/2020 14:03  Vitamin D, 25-Hydroxy Latest Ref Range: 30.0 - 100.0 ng/mL 51.2   2. Elevated LDL cholesterol level Angelica Young's LDL worsened to 128 from 116. Her triglycerides and HDL are within normal limits. ASCVD risk score 1%. Her hypertension is well controlled. She is not on statin therapy.  Lab Results  Component Value Date   ALT 13 12/10/2020   AST 18 12/10/2020   ALKPHOS 66 12/10/2020   BILITOT 0.3 12/10/2020   Lab Results  Component Value Date   CHOL 199 12/10/2020   HDL 62 12/10/2020   LDLCALC 128 (H) 12/10/2020   TRIG 50 12/10/2020   CHOLHDL 3.2 12/10/2020   The 10-year ASCVD risk score Angelica George DC Jr., et al., 2013) is: 1%   Values used to calculate the score:     Age: 40 years     Sex: Female     Is Non-Hispanic African American: Yes     Diabetic: No     Tobacco smoker: No     Systolic Blood Pressure: 120 mmHg     Is BP treated: Yes     HDL Cholesterol: 62 mg/dL     Total  Cholesterol: 199 mg/dL  Assessment/Plan:   1. Vitamin D deficiency . She agrees to continue to take OTC Vitamin D @5 ,000 IU everyday and will follow-up for routine testing of Vitamin D, at least 2-3 times per year to avoid over-replacement.  - Cholecalciferol (VITAMIN D) 125 MCG (5000 UT) CAPS; Take 1 capsule by mouth daily.  Dispense: 30 capsule; Refill: 0  2. Elevated LDL cholesterol level . Statin therapy is not indicated at this time. Continue with weight loss efforts.  3. Class 1 obesity with serious comorbidity and body mass index (BMI) of 34.0 to 34.9 in adult, unspecified obesity type Angelica Young is currently in the action stage of change. As such, her goal is to continue with weight loss efforts. She has agreed to keeping a food journal and adhering to recommended goals of 1100-1200 calories and 85 g protein.   Discussed protein rich foods.  Handout: Protein Content of Food.  Exercise goals: As is  Behavioral modification strategies: increasing lean protein intake, increasing vegetables, increasing water intake, decreasing sodium intake and keeping a strict food journal.  Angelica Young has agreed to follow-up with our clinic in 3-4 weeks.   Objective:   Blood pressure 120/83, pulse 72, temperature 98.5 F (36.9 C), height 5\' 2"  (1.575 m), weight 188  lb (85.3 kg), SpO2 98 %. Body mass index is 34.39 kg/m.  General: Cooperative, alert, well developed, in no acute distress. HEENT: Conjunctivae and lids unremarkable. Cardiovascular: Regular rhythm.  Lungs: Normal work of breathing. Neurologic: No focal deficits.   Lab Results  Component Value Date   CREATININE 0.69 12/10/2020   BUN 8 12/10/2020   NA 140 12/10/2020   K 4.1 12/10/2020   CL 97 12/10/2020   CO2 25 12/10/2020   Lab Results  Component Value Date   ALT 13 12/10/2020   AST 18 12/10/2020   ALKPHOS 66 12/10/2020   BILITOT 0.3 12/10/2020   Lab Results  Component Value Date   HGBA1C 5.4 12/10/2020   HGBA1C 5.5  07/02/2020   HGBA1C 5.6 12/26/2019   Lab Results  Component Value Date   INSULIN 8.8 12/10/2020   INSULIN 13.4 12/26/2019   No results found for: TSH Lab Results  Component Value Date   CHOL 199 12/10/2020   HDL 62 12/10/2020   LDLCALC 128 (H) 12/10/2020   TRIG 50 12/10/2020   CHOLHDL 3.2 12/10/2020   Lab Results  Component Value Date   WBC 7.7 12/10/2020   HGB 13.7 12/10/2020   HCT 40.5 12/10/2020   MCV 93 12/10/2020   PLT 328 12/10/2020   Lab Results  Component Value Date   IRON 77 07/02/2020   TIBC 368 07/02/2020   FERRITIN 22 07/02/2020     Attestation Statements:   Reviewed by clinician on day of visit: allergies, medications, problem list, medical history, surgical history, family history, social history, and previous encounter notes.  Edmund Hilda, am acting as Energy manager for [place], FNP.  I have reviewed the above documentation for accuracy and completeness, and I agree with the above. -  Jesse Sans, FNP

## 2020-12-25 ENCOUNTER — Other Ambulatory Visit: Payer: Self-pay

## 2020-12-25 ENCOUNTER — Encounter (INDEPENDENT_AMBULATORY_CARE_PROVIDER_SITE_OTHER): Payer: Self-pay | Admitting: Family Medicine

## 2020-12-25 ENCOUNTER — Ambulatory Visit
Admission: RE | Admit: 2020-12-25 | Discharge: 2020-12-25 | Disposition: A | Discharge status: Home / Self Care | Payer: BLUE CROSS/BLUE SHIELD | Source: Ambulatory Visit | Attending: Nurse Practitioner | Admitting: Nurse Practitioner

## 2020-12-25 DIAGNOSIS — Z1231 Encounter for screening mammogram for malignant neoplasm of breast: Secondary | ICD-10-CM | POA: Diagnosis not present

## 2021-01-06 ENCOUNTER — Other Ambulatory Visit (INDEPENDENT_AMBULATORY_CARE_PROVIDER_SITE_OTHER): Payer: Self-pay | Admitting: Adult Health

## 2021-01-06 DIAGNOSIS — R7303 Prediabetes: Secondary | ICD-10-CM

## 2021-01-07 NOTE — Telephone Encounter (Signed)
Refill request

## 2021-01-13 NOTE — Telephone Encounter (Signed)
Last seen by Katy Danford, FNP. 

## 2021-01-14 ENCOUNTER — Other Ambulatory Visit (INDEPENDENT_AMBULATORY_CARE_PROVIDER_SITE_OTHER): Payer: Self-pay | Admitting: Family Medicine

## 2021-01-14 DIAGNOSIS — R7303 Prediabetes: Secondary | ICD-10-CM

## 2021-01-14 MED ORDER — METFORMIN HCL ER 500 MG PO TB24: 500.0000 mg | ORAL_TABLET | Freq: Every day | ORAL | 0 refills | Status: DC

## 2021-01-14 NOTE — Telephone Encounter (Signed)
Rx refill request

## 2021-02-01 ENCOUNTER — Other Ambulatory Visit (INDEPENDENT_AMBULATORY_CARE_PROVIDER_SITE_OTHER): Payer: Self-pay | Admitting: Family Medicine

## 2021-02-01 DIAGNOSIS — R7303 Prediabetes: Secondary | ICD-10-CM

## 2021-02-03 NOTE — Telephone Encounter (Signed)
90 day supply request

## 2021-02-18 ENCOUNTER — Telehealth: Payer: BLUE CROSS/BLUE SHIELD | Admitting: Family Medicine

## 2021-02-20 ENCOUNTER — Telehealth: Payer: BLUE CROSS/BLUE SHIELD | Admitting: Family Medicine

## 2021-02-27 ENCOUNTER — Other Ambulatory Visit (INDEPENDENT_AMBULATORY_CARE_PROVIDER_SITE_OTHER): Payer: Self-pay | Admitting: Family Medicine

## 2021-02-27 DIAGNOSIS — R7303 Prediabetes: Secondary | ICD-10-CM

## 2021-03-13 ENCOUNTER — Encounter: Payer: Self-pay | Admitting: Nurse Practitioner

## 2021-03-17 ENCOUNTER — Other Ambulatory Visit: Payer: Self-pay

## 2021-03-17 ENCOUNTER — Ambulatory Visit: Payer: BLUE CROSS/BLUE SHIELD | Admitting: Nurse Practitioner

## 2021-03-17 ENCOUNTER — Encounter: Payer: Self-pay | Admitting: Nurse Practitioner

## 2021-03-17 VITALS — BP 130/82

## 2021-03-17 DIAGNOSIS — Z113 Encounter for screening for infections with a predominantly sexual mode of transmission: Secondary | ICD-10-CM

## 2021-03-17 DIAGNOSIS — N921 Excessive and frequent menstruation with irregular cycle: Secondary | ICD-10-CM

## 2021-03-17 DIAGNOSIS — Z975 Presence of (intrauterine) contraceptive device: Secondary | ICD-10-CM | POA: Diagnosis not present

## 2021-03-17 NOTE — Progress Notes (Signed)
   Acute Office Visit  Subjective:    Patient ID: Angelica Young, female    DOB: 01/31/1981, 40 y.o.   MRN: [id number]   HPI 40 y.o. presents today for spotting with IUD. Mirena inserted 06/2020, since insertion she has had light monthly cycles. LMP 02/28/2021 that was normal, but then began bleeding about a week later. Bleeding was moderate at first and has been light since. She had sexual intercourse for the first time in over a year and thought maybe it was related to that since the bleeding began shortly after. Used a condom but reports it fell off. History of benign endometrial polyps 06/2020, laparoscopy for endometriosis x 4.    Review of Systems  Constitutional: Negative.   Genitourinary: Positive for vaginal bleeding. Negative for dyspareunia, vaginal discharge and vaginal pain.       Objective:    Physical Exam Constitutional:      Appearance: Normal appearance.  Genitourinary:    General: Normal vulva.     Vagina: Normal.     Cervix: Normal.     Uterus: Normal.      Comments: IUD string visible about 2 cm. Small amount of brown blood present    BP 130/82   LMP 02/28/2021  Wt Readings from Last 3 Encounters:  12/23/20 188 lb (85.3 kg)  12/19/20 190 lb (86.2 kg)  12/10/20 184 lb (83.5 kg)        Assessment & Plan:   Problem List Items Addressed This Visit   None   Visit Diagnoses    Screen for STD (sexually transmitted disease)    -  Primary   Relevant Orders   SURESWAB CT/NG/T. vaginalis   Breakthrough bleeding with IUD         Plan: Gonorrhea/chlamydia/trich pending. Reassurance provided on correct positioning of IUD. If she experiences another episode of irregular bleeding it is recommended she return for an ultrasound due to history of endometrial polyps. She is agreeable to plan.      Olivia Mackie DNP, 4:18 PM 03/17/2021

## 2021-03-18 LAB — SURESWAB CT/NG/T. VAGINALIS
C. trachomatis RNA, TMA: NOT DETECTED
N. gonorrhoeae RNA, TMA: NOT DETECTED
Trichomonas vaginalis RNA: NOT DETECTED

## 2021-03-27 DIAGNOSIS — E559 Vitamin D deficiency, unspecified: Secondary | ICD-10-CM | POA: Diagnosis not present

## 2021-03-27 DIAGNOSIS — Z131 Encounter for screening for diabetes mellitus: Secondary | ICD-10-CM | POA: Diagnosis not present

## 2021-03-27 DIAGNOSIS — Z13220 Encounter for screening for lipoid disorders: Secondary | ICD-10-CM | POA: Diagnosis not present

## 2021-03-27 DIAGNOSIS — Z1159 Encounter for screening for other viral diseases: Secondary | ICD-10-CM | POA: Diagnosis not present

## 2021-03-27 DIAGNOSIS — Z0000 Encounter for general adult medical examination without abnormal findings: Secondary | ICD-10-CM | POA: Diagnosis not present

## 2021-04-21 DIAGNOSIS — G4733 Obstructive sleep apnea (adult) (pediatric): Secondary | ICD-10-CM | POA: Diagnosis not present

## 2021-04-23 DIAGNOSIS — Z01411 Encounter for gynecological examination (general) (routine) with abnormal findings: Secondary | ICD-10-CM | POA: Diagnosis not present

## 2021-04-23 DIAGNOSIS — Z113 Encounter for screening for infections with a predominantly sexual mode of transmission: Secondary | ICD-10-CM | POA: Diagnosis not present

## 2021-04-23 DIAGNOSIS — Z8742 Personal history of other diseases of the female genital tract: Secondary | ICD-10-CM | POA: Diagnosis not present

## 2021-04-23 DIAGNOSIS — N921 Excessive and frequent menstruation with irregular cycle: Secondary | ICD-10-CM | POA: Diagnosis not present

## 2021-04-23 DIAGNOSIS — Z01419 Encounter for gynecological examination (general) (routine) without abnormal findings: Secondary | ICD-10-CM | POA: Diagnosis not present

## 2021-05-05 DIAGNOSIS — G4733 Obstructive sleep apnea (adult) (pediatric): Secondary | ICD-10-CM | POA: Diagnosis not present

## 2021-05-05 DIAGNOSIS — R0683 Snoring: Secondary | ICD-10-CM | POA: Diagnosis not present

## 2021-05-05 DIAGNOSIS — Z6835 Body mass index (BMI) 35.0-35.9, adult: Secondary | ICD-10-CM | POA: Diagnosis not present

## 2021-05-05 DIAGNOSIS — I10 Essential (primary) hypertension: Secondary | ICD-10-CM | POA: Diagnosis not present

## 2021-05-09 DIAGNOSIS — J069 Acute upper respiratory infection, unspecified: Secondary | ICD-10-CM | POA: Diagnosis not present

## 2021-05-24 DIAGNOSIS — N83291 Other ovarian cyst, right side: Secondary | ICD-10-CM | POA: Diagnosis not present

## 2021-06-02 DIAGNOSIS — Z20822 Contact with and (suspected) exposure to covid-19: Secondary | ICD-10-CM | POA: Diagnosis not present

## 2021-06-06 DIAGNOSIS — U071 COVID-19: Secondary | ICD-10-CM | POA: Diagnosis not present

## 2021-06-09 DIAGNOSIS — Z20822 Contact with and (suspected) exposure to covid-19: Secondary | ICD-10-CM | POA: Diagnosis not present

## 2021-06-11 DIAGNOSIS — E6609 Other obesity due to excess calories: Secondary | ICD-10-CM | POA: Diagnosis not present

## 2021-06-11 DIAGNOSIS — I10 Essential (primary) hypertension: Secondary | ICD-10-CM | POA: Diagnosis not present

## 2021-06-11 DIAGNOSIS — Z136 Encounter for screening for cardiovascular disorders: Secondary | ICD-10-CM | POA: Diagnosis not present

## 2021-06-11 DIAGNOSIS — F331 Major depressive disorder, recurrent, moderate: Secondary | ICD-10-CM | POA: Diagnosis not present

## 2021-06-11 DIAGNOSIS — R399 Unspecified symptoms and signs involving the genitourinary system: Secondary | ICD-10-CM | POA: Diagnosis not present

## 2021-06-20 DIAGNOSIS — F4321 Adjustment disorder with depressed mood: Secondary | ICD-10-CM | POA: Diagnosis not present

## 2021-07-16 DIAGNOSIS — Z23 Encounter for immunization: Secondary | ICD-10-CM | POA: Diagnosis not present

## 2021-07-19 IMAGING — MG MM DIGITAL SCREENING BILAT W/ TOMO AND CAD
8 series · 8 of 24 positions shown · non-contrast
Comparison: None.

CLINICAL DATA: Screening.

EXAM:
DIGITAL SCREENING BILATERAL MAMMOGRAM WITH TOMOSYNTHESIS AND CAD
TECHNIQUE: Bilateral screening digital craniocaudal and mediolateral oblique
mammograms were obtained. Bilateral screening digital breast
tomosynthesis was performed. The images were evaluated with
computer-aided detection.

[R CC synth-2D]
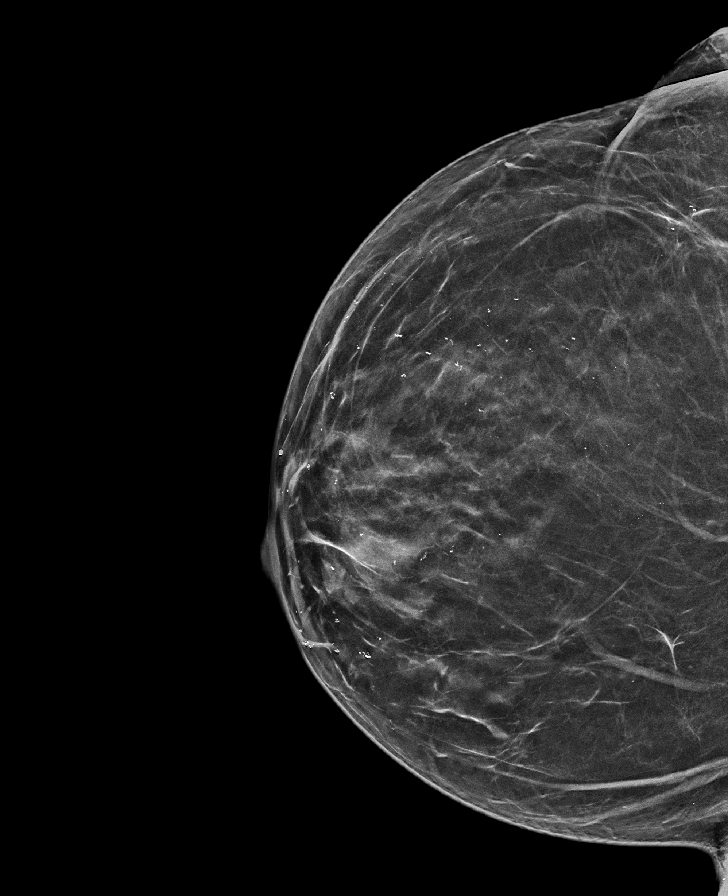

[R MLO synth-2D]
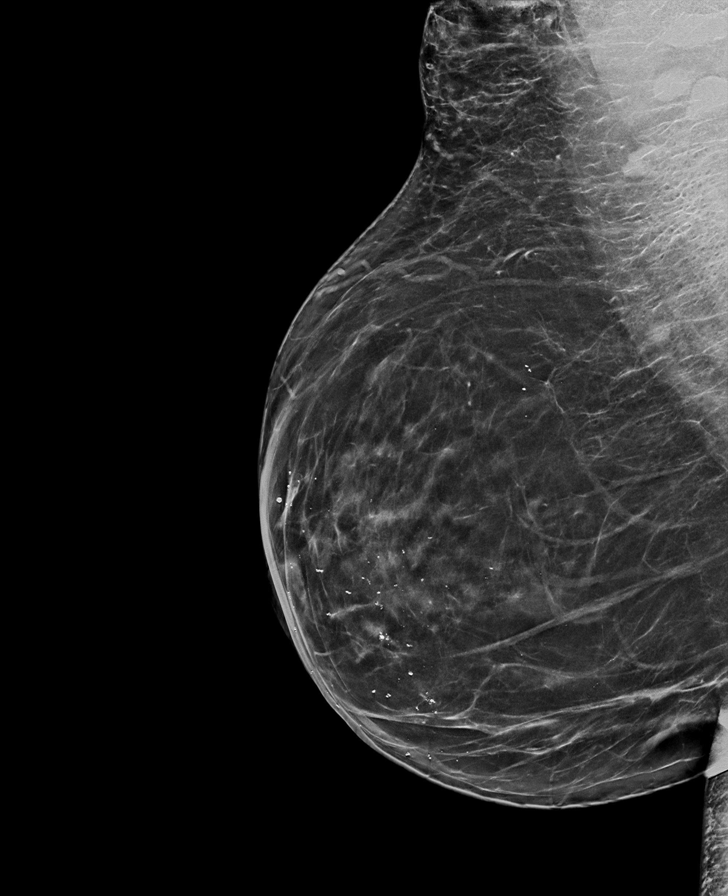

[L CC synth-2D]
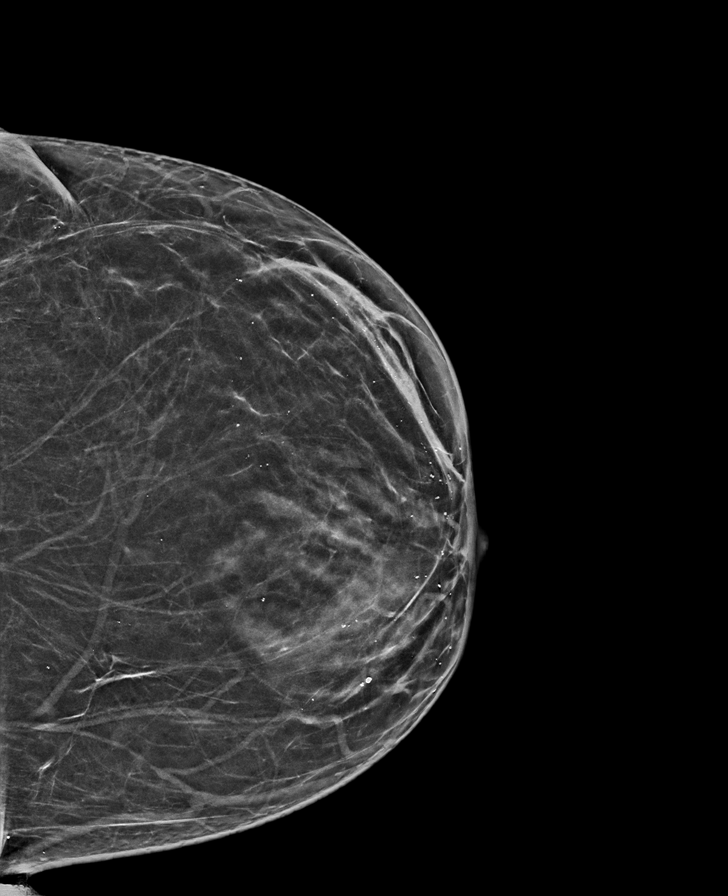

[L MLO synth-2D]
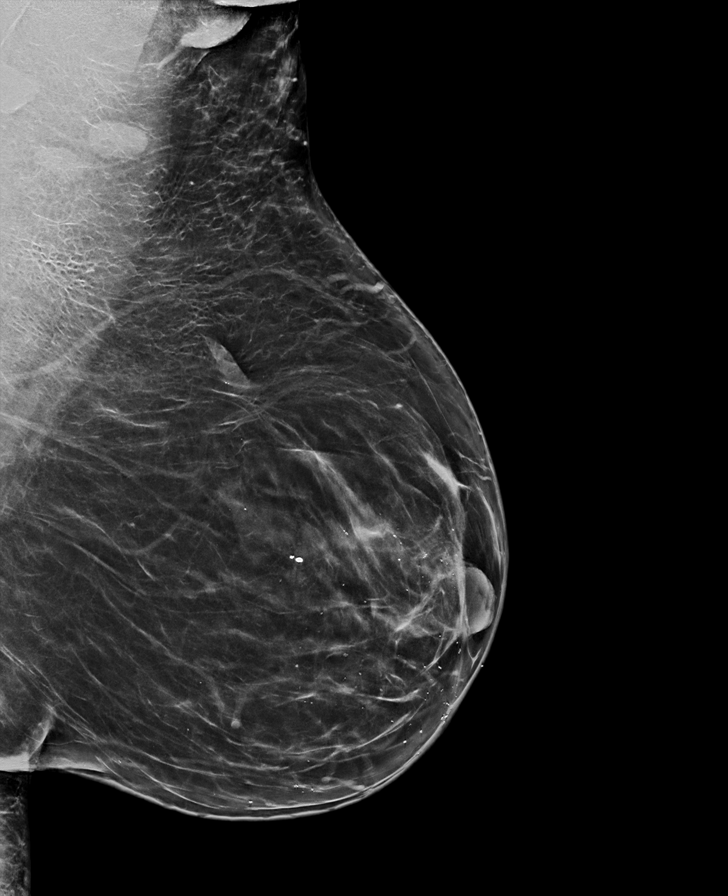

[R MLO tomo · tomo slice 38/75.0]
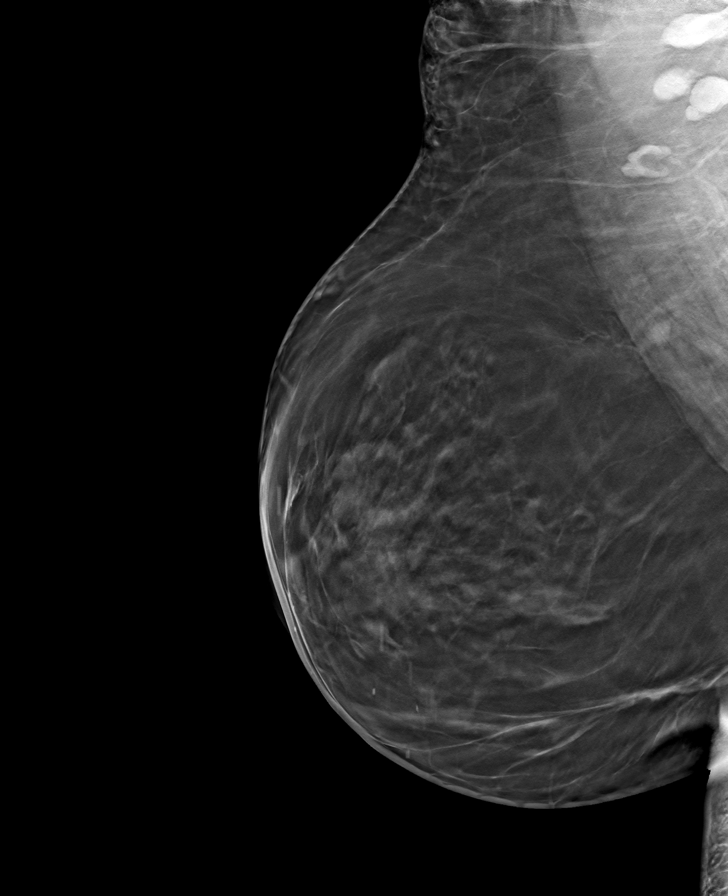

[L MLO tomo · tomo slice 41/82.0]
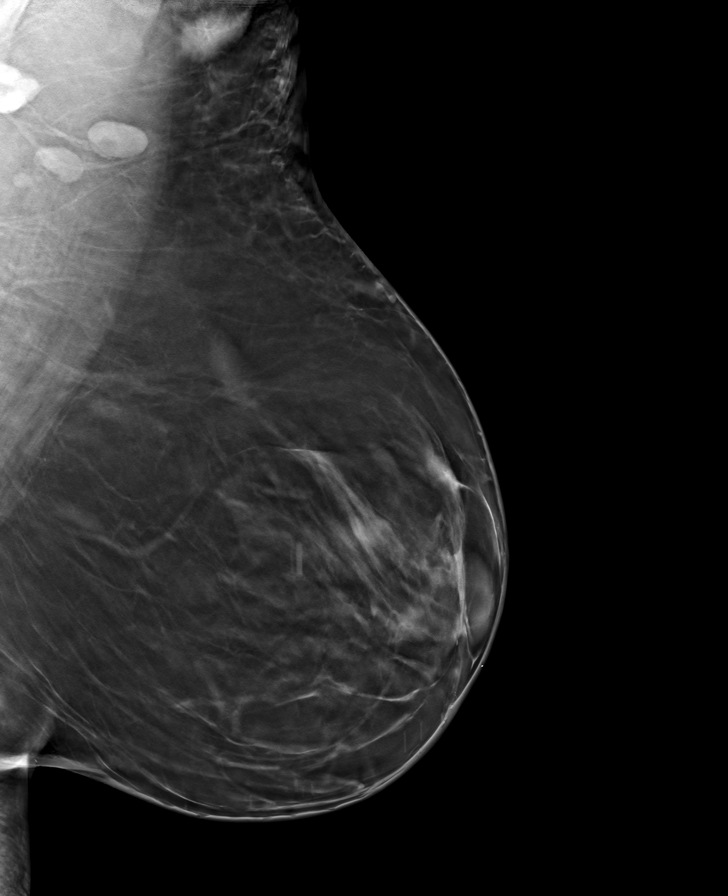

[R CC tomo · tomo slice 31/62.0]
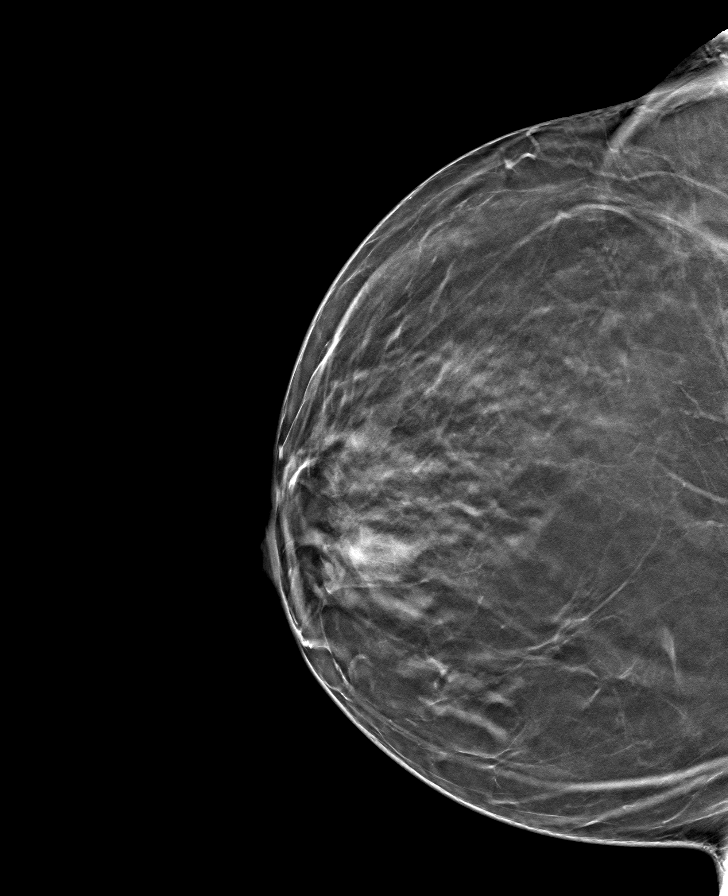

[L CC tomo · tomo slice 33/64.0]
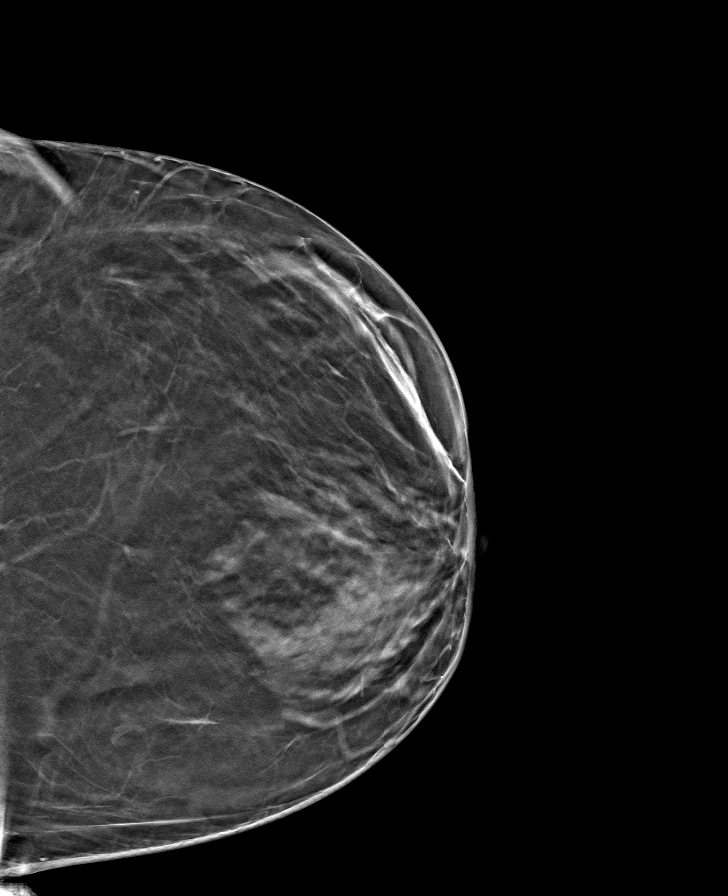

[8 of 24 positions shown; findings below may reference images not displayed]

ACR Breast Density Category b: There are scattered areas of
fibroglandular density.
FINDINGS: There are no findings suspicious for malignancy. The images were
evaluated with computer-aided detection.
IMPRESSION: No mammographic evidence of malignancy. A result letter of this
screening mammogram will be mailed directly to the patient.

RECOMMENDATION:
Screening mammogram in one year. (Code:C7-6-ASJ)

BI-RADS CATEGORY  1: Negative.

## 2021-07-22 DIAGNOSIS — F4321 Adjustment disorder with depressed mood: Secondary | ICD-10-CM | POA: Diagnosis not present

## 2021-07-25 ENCOUNTER — Ambulatory Visit: Payer: BC Managed Care – PPO | Admitting: Obstetrics and Gynecology

## 2021-07-25 ENCOUNTER — Other Ambulatory Visit: Payer: Self-pay

## 2021-07-25 ENCOUNTER — Encounter: Payer: Self-pay | Admitting: Obstetrics and Gynecology

## 2021-07-25 VITALS — BP 122/80 | HR 76 | Ht 62.0 in | Wt 196.0 lb

## 2021-07-25 DIAGNOSIS — Z8742 Personal history of other diseases of the female genital tract: Secondary | ICD-10-CM

## 2021-07-25 DIAGNOSIS — N83201 Unspecified ovarian cyst, right side: Secondary | ICD-10-CM | POA: Diagnosis not present

## 2021-07-25 DIAGNOSIS — R102 Pelvic and perineal pain: Secondary | ICD-10-CM

## 2021-07-25 MED ORDER — MEDROXYPROGESTERONE ACETATE 10 MG PO TABS: ORAL_TABLET | ORAL | 0 refills | Status: DC

## 2021-07-25 NOTE — Progress Notes (Signed)
GYNECOLOGY  VISIT   HPI: 40 y.o.   Single  African American  female   G2P0020 with No LMP recorded (lmp unknown). (Menstrual status: IUD).   here for pelvic pain severity 7, ovarian cyst.  Patient has no bleeding, but does have cramping and pain every day.  States she is functional.  She is considering a smaller IUD. All of the cramping started after the Mirena IUD was placed in September, 2021.  Takes Ibuprofen, Paprin, Excedrin, Advil for pain.  Sex is not painful. Does notice pelvic discomfort with Zumba class.   Her issue prior to the IUD was bleeding.  She received a blood transfusion due to anemia in 2019.  Had pelvic US [facility] on 05/24/21 Uterus 9.4 x 4.9 x 4.2 cm.  EMS 2 mm. No myometrial masses.  IUD in endometrial canal.  Right ovary 4.5 x 4.0 x 3.6 cm.   4.1 cm anechoic cyst.  Normal doppler flow to ovary.  Left ovary 4.7 x 3.9 x 2.6 cm.   Normal doppler flow to ovary.  No free fluid.  Prominent periuterine varices bilaterally.    Hx stage IV endometriosis and laparoscopy x 4.  First surgery in 2014. Hx Lupron use in the past and did not tolerate menopause well.  She has taken birth control in the past as well.  On Prozac.   Has HTN and takes blood pressure medication that requires her to have her liver function monitored.   Tried in vitro fertilization.  Wonders if she wants to give up her fertility and do hysterectomy.   Lives in [place].  Patient is a cousin of Dr. Dierdre Forth.  GYNECOLOGIC HISTORY: No LMP recorded (lmp unknown). (Menstrual status: IUD). Contraception: Mirena IUD inserted 06/2020. Menopausal hormone therapy:  none  Last mammogram: 12/2020 Last pap smear:   11/28/2019        OB History     Gravida  2   Para      Term      Preterm      AB  2   Living  0      SAB  2   IAB      Ectopic      Multiple      Live Births                 Patient Active Problem List   Diagnosis Date Noted   Vitamin D  deficiency 12/10/2020   History of anemia 12/10/2020   Pure hypercholesterolemia 12/10/2020   Low serum ferritin level 08/01/2020   Elevated alanine aminotransferase (ALT) level 08/01/2020   Prediabetes, Dx June 2020 05/16/2020   Class 1 obesity with serious comorbidity and body mass index (BMI) of 34.0 to 34.9 in adult 05/16/2020   Chronic migraine without aura without status migrainosus, not intractable 02/12/2020   Migraine with aura and without status migrainosus, not intractable 02/12/2020   Essential hypertension    Depression    Endometriosis 2014    Past Medical History:  Diagnosis Date   Abnormal uterine bleeding (AUB)    LAST 7 WEEKS   Complication of anesthesia    SLOW TO AWAKEN AT TIMES   Difficult intravenous access    LAST 6 OF 7 SURGERIES, STUCK X 3 ON 10-16-2019 SURGERY   Endometriosis 2014   Hypertension    Infertility, female    Migraines    PONV (postoperative nausea and vomiting)    NAUSEA MOSTLY VOMITING X 2 SURGERIES   Pre-diabetes  Skin abnormality    HIZRADENITIS SUPRIVITA   Sleep apnea    MILD NO CPAP USED IN  4 YEARS   Vitamin D deficiency     Past Surgical History:  Procedure Laterality Date   BREAST SURGERY     reduction 2009   DILATION AND CURETTAGE OF UTERUS     HERNIA REPAIR  2011   UMBILICAL   HYSTEROSCOPY WITH D & C N/A 06/24/2020   Procedure: DILATATION AND CURETTAGE /HYSTEROSCOPY; POLYPECTOMY;  Surgeon: Theresia Majors, MD;  Location: [facility] [place];  Service: Gynecology;  Laterality: N/A;  request to follow at 10am in time held for Dr. Penni Bombard. requests 30 minutes   INTRAUTERINE DEVICE (IUD) INSERTION N/A 06/24/2020   Procedure: INTRAUTERINE DEVICE (IUD) INSERTION WITH MIRENA;  Surgeon: Theresia Majors, MD;  Location: [facility] ;  Service: Gynecology;  Laterality: N/A;  Mirena IUD insertion/covered under medical plan.   LAPAROSCOPIC ASSISTED VENTRAL HERNIA REPAIR N/A 10/16/2019   Procedure: LAPAROSCOPIC  ASSISTED VENTRAL HERNIA REPAIR WITH MESH;  Surgeon: Luretha Murphy, MD;  Location: [facility];  Service: General;  Laterality: N/A;   NM MISC PROCEDURE  2018,2019   miscarried    PELVIC LAPAROSCOPY     x 4 for endometriosis   REDUCTION MAMMAPLASTY     2009    Current Outpatient Medications  Medication Sig Dispense Refill   Aspirin-Acetaminophen-Caffeine (PAMPRIN MAX PO) Take by mouth.     chlorthalidone (HYGROTON) 25 MG tablet Take 1 tablet (25 mg total) by mouth daily. 90 tablet 1   Cholecalciferol (VITAMIN D) 125 MCG (5000 UT) CAPS Take 1 capsule by mouth daily. 30 capsule 0   FLUoxetine (PROZAC) 10 MG capsule TAKE 1 CAPSULE BY MOUTH EVERY DAY FOR 90 DAYS     FLUoxetine (PROZAC) 20 MG capsule Take 20 mg by mouth daily.     levonorgestrel (MIRENA) 20 MCG/24HR IUD 1 each by Intrauterine route once.     metFORMIN (GLUCOPHAGE XR) 500 MG 24 hr tablet Take 1 tablet (500 mg total) by mouth daily with breakfast. 30 tablet 0   Multiple Vitamin (MULTIVITAMIN PO) Take 30 mLs by mouth daily. Source of life     SUMAtriptan (IMITREX) 100 MG tablet Take 1 tablet (100 mg total) by mouth once as needed for up to 1 dose. May repeat in 2 hours if headache persists or recurs. 10 tablet 12   verapamil (CALAN-SR) 180 MG CR tablet Take 180 mg by mouth at bedtime.     vitamin B-12 (CYANOCOBALAMIN) 100 MCG tablet Take 500 mcg by mouth daily.     No current facility-administered medications for this visit.     ALLERGIES: Vicodin [hydrocodone-acetaminophen] and Sulfa antibiotics  Family History  Problem Relation Age of Onset   Migraines Mother    CAD Father    Hypertension Father    Stroke Father    Sleep apnea Father    Migraines Maternal Grandmother    Migraines Sister     Social History   Socioeconomic History   Marital status: Single    Spouse name: Not on file   Number of children: 0   Years of education: Not on file   Highest education level: Master's degree (e.g., MA, MS, MEng, MEd, MSW,  MBA)  Occupational History   Occupation: HR Manager  Tobacco Use   Smoking status: Never   Smokeless tobacco: Never  Vaping Use   Vaping Use: Never used  Substance and Sexual Activity   Alcohol use: Yes  Comment: occas.   Drug use: Never   Sexual activity: Yes    Birth control/protection: I.U.D.    Comment: 1st intercourse 40yo-Fewer than 5 partners  Other Topics Concern   Not on file  Social History Narrative   Lives at home alone   Right handed   Caffeine: 1 cup/day   Social Determinants of Health   Financial Resource Strain: Not on file  Food Insecurity: Not on file  Transportation Needs: Not on file  Physical Activity: Not on file  Stress: Not on file  Social Connections: Not on file  Intimate Partner Violence: Not on file    Review of Systems  see HPI  PHYSICAL EXAMINATION:    BP 122/80 (BP Location: Right Arm, Patient Position: Sitting, Cuff Size: Normal)   Pulse 76   Ht 5\' 2"  (1.575 m)   Wt 196 lb (88.9 kg)   LMP  (LMP Unknown)   SpO2 97%   BMI 35.85 kg/m     General appearance: alert, cooperative and appears stated age  Abdomen: soft, non-tender, midline lower abdominal tenderness with no guarding or rebound, no organomegaly  Pelvic: External genitalia:  no lesions              Urethra:  normal appearing urethra with no masses, tenderness or lesions              Bartholins and Skenes: normal                 Vagina: normal appearing vagina with normal color and discharge, no lesions              Cervix: no lesions                Bimanual Exam:  Uterus:  normal size, contour, position, consistency, mobility, and tender              Adnexa: no mass, fullness, tenderness        Chaperone was present for exam:  Kimalexis, CMA  ASSESSMENT  Pelvic pain. Right ovarian cyst.  Hx stage IV endometriosis.  Mirena IUD. Possible pelvic congestion. HTN.   PLAN  We reviewed her recent pelvic ultrasound report and compared to her images of her last  pelvic here 07/30/20. Pelvic congestion reviewed and treatment with Nexplanon or Provera 30 mg daily.  08/01/20, hysterectomy, Dewayne Hatch also discussed. Written information given.  She will start Provera 30 mg daily for one month.   Side effects discussed.  Return or pelvic Rutha Bouchard. I did mention the [facility] pelvic pain clinic in [place], [place] for medical and/or surgical management.  If she chooses hysterectomy, I would refer her to this group or GYN ON[facility].   An After Visit Summary was printed and given to the patient.  40 min  total time was spent for this patient encounter, including preparation, face-to-face counseling with the patient, coordination of care, and documentation of the encounter.

## 2021-07-25 NOTE — Patient Instructions (Signed)
Medroxyprogesterone Tablets What is this medication? MEDROXYPROGESTERONE (me DROX ee proe JES te rone) treats irregular menstrual cycles. It may also be used to prevent the lining of the uterus from becoming too thick in people taking estrogen after menopause. It works by increasing levels of the hormone progestin in the body. This medication is a progestin hormone. This medicine may be used for other purposes; ask your health care provider or pharmacist if you have questions. COMMON BRAND NAME(S): Amen, Provera What should I tell my care team before I take this medication? They need to know if you have any of these conditions: Blood vessel disease or a history of a blood clot in the lungs or legs Breast, cervical, or vaginal cancer Heart disease Kidney disease Liver disease Migraine Recent miscarriage or abortion Mental depression Seizures Stroke Vaginal bleeding that has not been evaluated An unusual or allergic reaction to medroxyprogesterone, other medications, foods, dyes, or preservatives Pregnant or trying to get pregnant Breast-feeding How should I use this medication? Take this medication by mouth with a glass of water. Follow the directions on the prescription label. Take your doses at regular intervals. Do not take your medication more often than directed. Talk to your care team about the use of this medication in children. Special care may be needed. While this medication may be prescribed for children as young as 13 years for selected conditions, precautions do apply. Overdosage: If you think you have taken too much of this medicine contact a poison control center or emergency room at once. NOTE: This medicine is only for you. Do not share this medicine with others. What if I miss a dose? If you miss a dose, take it as soon as you can. If it is almost time for your next dose, take only that dose. Do not take double or extra doses. What may interact with this  medication? Barbiturate medications for inducing sleep or treating seizures Bosentan Carbamazepine Phenytoin Rifampin St. John's Wort This list may not describe all possible interactions. Give your health care provider a list of all the medicines, herbs, non-prescription drugs, or dietary supplements you use. Also tell them if you smoke, drink alcohol, or use illegal drugs. Some items may interact with your medicine. What should I watch for while using this medication? Visit your care team for regular checks on your progress. You will need a regular breast and pelvic exam. If you have any reason to think you are pregnant, stop taking this medication at once and contact your care team. What side effects may I notice from receiving this medication? Side effects that you should report to your care team as soon as possible: Allergic reactions-skin rash, itching, hives, swelling of the face, lips, tongue, or throat Blood clot-pain, swelling, or warmth in the leg, shortness of breath, chest pain Breast tissue changes, new lumps, redness, pain, or discharge from the nipple Gallbladder problems-severe stomach pain, nausea, vomiting, fever Increase in blood pressure Liver injury-right upper belly pain, loss of appetite, nausea, light-colored stool, dark yellow or brown urine, yellowing skin or eyes, unusual weakness or fatigue Stroke-sudden numbness or weakness of the face, arm, or leg, trouble speaking, confusion, trouble walking, loss of balance or coordination, dizziness, severe headache, change in vision Sudden eye pain or change in vision such as blurry vision, seeing halos around lights, vision loss Unusual vaginal discharge, itching, or odor Vaginal bleeding after menopause, pelvic pain Worsening mood, feelings of depression Side effects that usually do not require medical attention (report  to your care team if they continue or are bothersome): Breast pain or tenderness Change in sex drive or  performance Headache Irregular menstrual cycles or spotting Nausea Weight gain This list may not describe all possible side effects. Call your doctor for medical advice about side effects. You may report side effects to FDA at 1-800-FDA-1088. Where should I keep my medication? Keep out of the reach of children and pets. Store at room temperature between 20 and 25 degrees C (68 and 77 degrees F). Throw away any unused medication after the expiration date. NOTE: This sheet is a summary. It may not cover all possible information. If you have questions about this medicine, talk to your doctor, pharmacist, or health care provider.  2022 Elsevier/Gold Standard (2020-11-04 12:43:13)  Elagolix tablets What is this medication? ELAGOLIX (el a GOE lix) is used to treat endometriosis in women. It reduces pain from the condition and may help reduce painful sexual intercourse. This medicine may be used for other purposes; ask your health care provider or pharmacist if you have questions. COMMON BRAND NAME(S): Dewayne Hatch What should I tell my care team before I take this medication? They need to know if you have any of these conditions: depression liver disease mental illness osteoporosis, weak bones suicidal thoughts, plans, or attempt; a previous suicide attempt by you or a family member an unusual or allergic reaction to elagolix, other medicines, foods, dyes, or preservatives pregnant or trying to get pregnant breast-feeding How should I use this medication? Take this medicine by mouth with a full glass of water. Take it as directed on the prescription label at the same time every day. You can take it with or without food. If it upsets your stomach, take it with food. Keep taking it unless your health care provider tells you to stop. A special MedGuide will be given to you by the pharmacist with each prescription and refill. Be sure to read this information carefully each time. Talk to your  pediatrician regarding the use of this medicine in children. This medicine is not approved for use in children. Overdosage: If you think you have taken too much of this medicine contact a poison control center or emergency room at once. NOTE: This medicine is only for you. Do not share this medicine with others. What if I miss a dose? If you miss a dose, take it as soon as you can. If it is almost time for your next dose, take only that dose. Do not take double or extra doses. What may interact with this medication? Do not take this medicine with any of the following medications: cyclosporine enasidenib gemfibrozil This medicine may also interact with the following medications: certain antivirals for HIV or hepatitis citalopram digoxin female hormones, like estrogens or progestins and birth control pills, patches, rings, or injections methadone midazolam omeprazole rifampin rosuvastatin This list may not describe all possible interactions. Give your health care provider a list of all the medicines, herbs, non-prescription drugs, or dietary supplements you use. Also tell them if you smoke, drink alcohol, or use illegal drugs. Some items may interact with your medicine. What should I watch for while using this medication? Visit your doctor or health care professional for regular checks on your progress. This medicine may cause weak bones (osteoporosis). Only use this product for the amount of time your health care professional tells you to. The longer you use this product the more likely you will be at risk for weak bones. Ask your health  care professional how you can keep strong bones. Patients and their families should watch out for new or worsening depression or thoughts of suicide. Also watch out for sudden changes in feelings such as feeling anxious, agitated, panicky, irritable, hostile, aggressive, impulsive, severely restless, overly excited and hyperactive, or not being able to sleep.  If this happens, call your health care professional. You may have a change in bleeding pattern or irregular periods. Many females stop having periods while taking this drug. Do not become pregnant while taking this medicine. Women should inform their doctor if they wish to become pregnant or think they might be pregnant. There is a potential for serious side effects to an unborn child. Talk to your health care professional or pharmacist for more information. This medicine does not prevent pregnancy. Women must use effective birth control with this medicine. Use a non-hormonal form of birth control while taking this medicine and for 28 days after stopping it. Talk to your health care professional about how to prevent pregnancy. What side effects may I notice from receiving this medication? Side effects that you should report to your doctor or health care professional as soon as possible: allergic reactions like skin rash, itching or hives, swelling of the face, lips, or tongue anxious depressed mood signs and symptoms of liver injury like dark yellow or brown urine; general ill feeling or flu-like symptoms; light-colored stools; loss of appetite; nausea; right upper belly pain; unusually weak or tired; yellowing of the eyes or skin suicidal thoughts or other mood changes Side effects that usually do not require medical attention (report these to your doctor or health care professional if they continue or are bothersome): reduced or absent menstrual periods headache hot flashes or night sweats nausea joint pain trouble sleeping This list may not describe all possible side effects. Call your doctor for medical advice about side effects. You may report side effects to FDA at 1-800-FDA-1088. Where should I keep my medication? Keep out of the reach of children and pets. Store at room temperature between 20 and 25 degrees C (68 and 77 degrees F). Get rid of any unused medicine after the expiration  date. It is important to get rid of the medicine as soon as you no longer need it or it is expired. You can do this in two ways: Take the medicine to a medicine take-back program. Check with your pharmacy or law enforcement to find a location. If you cannot return the medicine, follow the directions in the MedGuide. NOTE: This sheet is a summary. It may not cover all possible information. If you have questions about this medicine, talk to your doctor, pharmacist, or health care provider.  2022 Elsevier/Gold Standard (2020-01-09 08:56:37)  ITotal Laparoscopic Hysterectomy A total laparoscopic hysterectomy is a minimally invasive surgery to remove the uterus and cervix. The fallopian tubes and ovaries can also be removed during this surgery, if necessary. This procedure may be done to treat problems such as: Growths in the uterus (uterine fibroids) that are not cancer but cause symptoms. A condition that causes the lining of the uterus to grow in other areas (endometriosis). Problems with pelvic support. Cancer of the cervix, ovaries, uterus, or tissue that lines the uterus (endometrium). Excessive bleeding in the uterus. After this procedure, you will no longer be able to have a baby, and you will no longer have a menstrual period. Tell a health care provider about: Any allergies you have. All medicines you are taking, including vitamins, herbs, eye  drops, creams, and over-the-counter medicines. Any problems you or family members have had with anesthetic medicines. Any blood disorders you have. Any surgeries you have had. Any medical conditions you have. Whether you are pregnant or may be pregnant. What are the risks? Generally, this is a safe procedure. However, problems may occur, including: Infection. Bleeding. Blood clots in the legs or lungs. Allergic reactions to medicines. Damage to nearby structures or organs. Having to change from this surgery to one in which a large incision  is made in the abdomen (abdominal hysterectomy). What happens before the procedure? Staying hydrated Follow instructions from your health care provider about hydration, which may include: Up to 2 hours before the procedure - you may continue to drink clear liquids, such as water, clear fruit juice, black coffee, and plain tea.  Eating and drinking restrictions Follow instructions from your health care provider about eating and drinking, which may include: 8 hours before the procedure - stop eating heavy meals or foods, such as meat, fried foods, or fatty foods. 6 hours before the procedure - stop eating light meals or foods, such as toast or cereal. 6 hours before the procedure - stop drinking milk or drinks that contain milk. 2 hours before the procedure - stop drinking clear liquids. Medicines Ask your health care provider about: Changing or stopping your regular medicines. This is especially important if you are taking diabetes medicines or blood thinners. Taking medicines such as aspirin and ibuprofen. These medicines can thin your blood. Do not take these medicines unless your health care provider tells you to take them. Taking over-the-counter medicines, vitamins, herbs, and supplements. You may be asked to take medicine that helps you have a bowel movement (laxative) to prevent constipation. General instructions If you were asked to do bowel preparation before the procedure, follow instructions from your health care provider. This procedure can affect the way you feel about yourself. Talk with your health care provider about the physical and emotional changes hysterectomy may cause. Do not use any products that contain nicotine or tobacco for at least 4 weeks before the procedure. These products include cigarettes, chewing tobacco, and vaping devices, such as e-cigarettes. If you need help quitting, ask your health care provider. Plan to have a responsible adult take you home from the  hospital or clinic. Plan to have a responsible adult care for you for the time you are told after you leave the hospital or clinic. This is important. Surgery safety Ask your health care provider: How your surgery site will be marked. What steps will be taken to help prevent infection. These may include: Removing hair at the surgery site. Washing skin with a germ-killing soap. Receiving antibiotic medicine. What happens during the procedure? An IV will be inserted into one of your veins. You will be given one or more of the following: A medicine to help you relax (sedative). A medicine to make you fall asleep (general anesthetic). A medicine to numb the area (local anesthetic). A medicine that is injected into your spine to numb the area below and slightly above the injection site (spinal anesthetic). A medicine that is injected into an area of your body to numb everything below the injection site (regional anesthetic). A gas will be used to inflate your abdomen. This will allow your surgeon to look inside your abdomen and do the surgery. Three or four small incisions will be made in your abdomen. A small device with a light (laparoscope) will be inserted into one  of your incisions. Surgical instruments will be inserted through the other incisions in order to perform the procedure. Your uterus and cervix may be removed through your vagina or cut into small pieces and removed through the small incisions. Any other organs that need to be removed will also be removed this way. The gas will be released from inside your abdomen. Your incisions will be closed with stitches (sutures), skin glue, or adhesive strips. A bandage (dressing) may be placed over your incisions. The procedure may vary among health care providers and hospitals. What happens after the procedure? Your blood pressure, heart rate, breathing rate, and blood oxygen level will be monitored until you leave the hospital or  clinic. You will be given medicine for pain as needed. You will be encouraged to walk as soon as possible. You will also use a device to help you breathe or do breathing exercises to keep your lungs clear. You may have to wear compression stockings. These stockings help to prevent blood clots and reduce swelling in your legs. You will need to wear a sanitary pad for vaginal discharge or bleeding. Summary Total laparoscopic hysterectomy is a procedure to remove your uterus, cervix, and sometimes the fallopian tubes and ovaries. This procedure can affect the way you feel about yourself. Talk with your health care provider about the physical and emotional changes hysterectomy may cause. After this procedure, you will no longer be able to have a baby, and you will no longer have a menstrual period. You will be given pain medicine to control discomfort after this procedure. Plan to have a responsible adult take you home from the hospital or clinic. This information is not intended to replace advice given to you by your health care provider. Make sure you discuss any questions you have with your health care provider. Document Revised: 05/31/2020 Document Reviewed: 05/31/2020 Elsevier Patient Education  2022 [employer].

## 2021-08-05 DIAGNOSIS — F4321 Adjustment disorder with depressed mood: Secondary | ICD-10-CM | POA: Diagnosis not present

## 2021-08-07 DIAGNOSIS — J069 Acute upper respiratory infection, unspecified: Secondary | ICD-10-CM | POA: Diagnosis not present

## 2021-08-07 DIAGNOSIS — R051 Acute cough: Secondary | ICD-10-CM | POA: Diagnosis not present

## 2021-08-13 DIAGNOSIS — F4321 Adjustment disorder with depressed mood: Secondary | ICD-10-CM | POA: Diagnosis not present

## 2021-08-16 ENCOUNTER — Other Ambulatory Visit: Payer: Self-pay | Admitting: Obstetrics and Gynecology

## 2021-08-18 NOTE — Telephone Encounter (Signed)
Per note 07/25/21  "She will start Provera 30 mg daily for one month.   Side effects discussed.  Return or pelvic US."  Pelvic ultrasound scheduled on 08/19/21

## 2021-08-19 ENCOUNTER — Encounter: Payer: Self-pay | Admitting: Obstetrics and Gynecology

## 2021-08-19 ENCOUNTER — Ambulatory Visit (INDEPENDENT_AMBULATORY_CARE_PROVIDER_SITE_OTHER): Payer: BC Managed Care – PPO

## 2021-08-19 ENCOUNTER — Ambulatory Visit: Payer: BC Managed Care – PPO | Admitting: Obstetrics and Gynecology

## 2021-08-19 ENCOUNTER — Other Ambulatory Visit: Payer: Self-pay

## 2021-08-19 VITALS — BP 124/80 | HR 88 | Ht 62.0 in | Wt 200.0 lb

## 2021-08-19 DIAGNOSIS — N83201 Unspecified ovarian cyst, right side: Secondary | ICD-10-CM | POA: Diagnosis not present

## 2021-08-19 DIAGNOSIS — R102 Pelvic and perineal pain: Secondary | ICD-10-CM | POA: Diagnosis not present

## 2021-08-19 DIAGNOSIS — Z30431 Encounter for routine checking of intrauterine contraceptive device: Secondary | ICD-10-CM

## 2021-08-19 DIAGNOSIS — Z6836 Body mass index (BMI) 36.0-36.9, adult: Secondary | ICD-10-CM | POA: Diagnosis not present

## 2021-08-19 DIAGNOSIS — Z8742 Personal history of other diseases of the female genital tract: Secondary | ICD-10-CM | POA: Diagnosis not present

## 2021-08-19 NOTE — Patient Instructions (Signed)
Mediterranean Diet °A Mediterranean diet refers to food and lifestyle choices that are based on the traditions of countries located on the [place]. It focuses on eating more fruits, vegetables, whole grains, beans, nuts, seeds, and heart-healthy fats, and eating less dairy, meat, eggs, and processed foods with added sugar, salt, and fat. This way of eating has been shown to help prevent certain conditions and improve outcomes for people who have chronic diseases, like kidney disease and heart disease. °What are tips for following this plan? °Reading food labels °Check the serving size of packaged foods. For foods such as rice and pasta, the serving size refers to the amount of cooked product, not dry. °Check the total fat in packaged foods. Avoid foods that have saturated fat or trans fats. °Check the ingredient list for added sugars, such as corn syrup. °Shopping ° °Buy a variety of foods that offer a balanced diet, including: °Fresh fruits and vegetables (produce). °Grains, beans, nuts, and seeds. Some of these may be available in unpackaged forms or large amounts (in bulk). °Fresh seafood. °Poultry and eggs. °Low-fat dairy products. °Buy whole ingredients instead of prepackaged foods. °Buy fresh fruits and vegetables in-season from local farmers markets. °Buy plain frozen fruits and vegetables. °If you do not have access to quality fresh seafood, buy precooked frozen shrimp or canned fish, such as tuna, salmon, or sardines. °Stock your pantry so you always have certain foods on hand, such as olive oil, canned tuna, canned tomatoes, rice, pasta, and beans. °Cooking °Cook foods with extra-virgin olive oil instead of using butter or other vegetable oils. °Have meat as a side dish, and have vegetables or grains as your main dish. This means having meat in small portions or adding small amounts of meat to foods like pasta or stew. °Use beans or vegetables instead of meat in common dishes like chili or  lasagna. °Experiment with different cooking methods. Try roasting, broiling, steaming, and sautéing vegetables. °Add frozen vegetables to soups, stews, pasta, or rice. °Add nuts or seeds for added healthy fats and plant protein at each meal. You can add these to yogurt, salads, or vegetable dishes. °Marinate fish or vegetables using olive oil, lemon juice, garlic, and fresh herbs. °Meal planning °Plan to eat one vegetarian meal one day each week. Try to work up to two vegetarian meals, if possible. °Eat seafood two or more times a week. °Have healthy snacks readily available, such as: °Vegetable sticks with hummus. °Greek yogurt. °Fruit and nut trail mix. °Eat balanced meals throughout the week. This includes: °Fruit: 2-3 servings a day. °Vegetables: 4-5 servings a day. °Low-fat dairy: 2 servings a day. °Fish, poultry, or lean meat: 1 serving a day. °Beans and legumes: 2 or more servings a week. °Nuts and seeds: 1-2 servings a day. °Whole grains: 6-8 servings a day. °Extra-virgin olive oil: 3-4 servings a day. °Limit red meat and sweets to only a few servings a month. °Lifestyle ° °Cook and eat meals together with your family, when possible. °Drink enough fluid to keep your urine pale yellow. °Be physically active every day. This includes: °Aerobic exercise like running or swimming. °Leisure activities like gardening, walking, or housework. °Get 7-8 hours of sleep each night. °If recommended by your health care provider, drink red wine in moderation. This means 1 glass a day for nonpregnant women and 2 glasses a day for men. A glass of wine equals 5 oz (150 mL). °What foods should I eat? °Fruits °Apples. Apricots. Avocado. Berries. Bananas. Cherries. Dates.   Figs. Grapes. Lemons. Melon. Oranges. Peaches. Plums. Pomegranate. °Vegetables °Artichokes. Beets. Broccoli. Cabbage. Carrots. Eggplant. Green beans. Chard. Kale. Spinach. Onions. Leeks. Peas. Squash. Tomatoes. Peppers. Radishes. °Grains °Whole-grain pasta. Brown  rice. Bulgur wheat. Polenta. Couscous. Whole-wheat bread. Oatmeal. Quinoa. °Meats and other proteins °Beans. Almonds. Sunflower seeds. Pine nuts. Peanuts. Cod. Salmon. Scallops. Shrimp. Tuna. Tilapia. Clams. Oysters. Eggs. Poultry without skin. °Dairy °Low-fat milk. Cheese. Greek yogurt. °Fats and oils °Extra-virgin olive oil. Avocado oil. Grapeseed oil. °Beverages °Water. Red wine. Herbal tea. °Sweets and desserts °Greek yogurt with honey. Baked apples. Poached pears. Trail mix. °Seasonings and condiments °Basil. Cilantro. Coriander. Cumin. Mint. Parsley. Sage. Rosemary. Tarragon. Garlic. Oregano. Thyme. Pepper. Balsamic vinegar. Tahini. Hummus. Tomato sauce. Olives. Mushrooms. °The items listed above may not be a complete list of foods and beverages you can eat. Contact a dietitian for more information. °What foods should I limit? °This is a list of foods that should be eaten rarely or only on special occasions. °Fruits °Fruit canned in syrup. °Vegetables °Deep-fried potatoes (french fries). °Grains °Prepackaged pasta or rice dishes. Prepackaged cereal with added sugar. Prepackaged snacks with added sugar. °Meats and other proteins °Beef. Pork. Lamb. Poultry with skin. Hot dogs. Bacon. °Dairy °Ice cream. Sour cream. Whole milk. °Fats and oils °Butter. Canola oil. Vegetable oil. Beef fat (tallow). Lard. °Beverages °Juice. Sugar-sweetened soft drinks. Beer. Liquor and spirits. °Sweets and desserts °Cookies. Cakes. Pies. Candy. °Seasonings and condiments °Mayonnaise. Pre-made sauces and marinades. °The items listed above may not be a complete list of foods and beverages you should limit. Contact a dietitian for more information. °Summary °The Mediterranean diet includes both food and lifestyle choices. °Eat a variety of fresh fruits and vegetables, beans, nuts, seeds, and whole grains. °Limit the amount of red meat and sweets that you eat. °If recommended by your health care provider, drink red wine in moderation.  This means 1 glass a day for nonpregnant women and 2 glasses a day for men. A glass of wine equals 5 oz (150 mL). °This information is not intended to replace advice given to you by your health care provider. Make sure you discuss any questions you have with your health care provider. °Document Revised: 11/03/2019 Document Reviewed: 08/31/2019 °Elsevier Patient Education © 2022 [employer] ° °

## 2021-08-19 NOTE — Progress Notes (Signed)
GYNECOLOGY  VISIT   HPI: 40 y.o.   Single  African American  female   [id number] with No LMP recorded (lmp unknown). (Menstrual status: IUD).   here for follow up pelvic ultrasound to check right ovarian cyst.   She had a pelvic US in [facility] 05/24/21 showing a 4.1 cm anechoic right ovarian cyst and prominent varices bilaterally.  Her IUD was in a normal position in the endometrial canal.   After her visit here on 07/25/21, she received Rx for Provera 30 mg daily to treat pelvic pain and pelvic congestion. Took for about 15 days total. She developed depression, bloating, and weight gain with the medication use. She is not having the pelvic pain which she had been experiencing previously.   Dr. Penni Bombard placed her Mirena IUD in September, 2021.  She has a history of stage IV endometriosis. Wants to be able to do endometriosis care closer to [place], [place], which is where she lives and works.  Does counseling for depression.   She wants to do a Mediterranean diet.   GYNECOLOGIC HISTORY: No LMP recorded (lmp unknown). (Menstrual status: IUD). Contraception:  Mirena 06/2020 Menopausal hormone therapy:  n/a Last mammogram: 12-25-20  3D//Neg/BiRads1 Last pap smear:  11-28-19 Neg        OB History     Gravida  2   Para      Term      Preterm      AB  2   Living  0      SAB  2   IAB      Ectopic      Multiple      Live Births                 Patient Active Problem List   Diagnosis Date Noted   Vitamin D deficiency 12/10/2020   History of anemia 12/10/2020   Pure hypercholesterolemia 12/10/2020   Low serum ferritin level 08/01/2020   Elevated alanine aminotransferase (ALT) level 08/01/2020   Prediabetes, Dx June 2020 05/16/2020   Class 1 obesity with serious comorbidity and body mass index (BMI) of 34.0 to 34.9 in adult 05/16/2020   Chronic migraine without aura without status migrainosus, not intractable 02/12/2020   Migraine with aura and without status  migrainosus, not intractable 02/12/2020   Essential hypertension    Depression    Endometriosis 2014    Past Medical History:  Diagnosis Date   Abnormal uterine bleeding (AUB)    LAST 7 WEEKS   Complication of anesthesia    SLOW TO AWAKEN AT TIMES   Difficult intravenous access    LAST 6 OF 7 SURGERIES, STUCK X 3 ON 10-16-2019 SURGERY   Endometriosis 2014   Hypertension    Infertility, female    Migraines    PONV (postoperative nausea and vomiting)    NAUSEA MOSTLY VOMITING X 2 SURGERIES   Pre-diabetes    Skin abnormality    HIZRADENITIS SUPRIVITA   Sleep apnea    MILD NO CPAP USED IN  4 YEARS   Vitamin D deficiency     Past Surgical History:  Procedure Laterality Date   BREAST SURGERY     reduction 2009   DILATION AND CURETTAGE OF UTERUS     HERNIA REPAIR  2011   UMBILICAL   HYSTEROSCOPY WITH D & C N/A 06/24/2020   Procedure: DILATATION AND CURETTAGE /HYSTEROSCOPY; POLYPECTOMY;  Surgeon: Theresia Majors, MD;  Location: [facility];  Service: Gynecology;  Laterality:  N/A;  request to follow at 10am in time held for Dr. Penni Bombard. requests 30 minutes   INTRAUTERINE DEVICE (IUD) INSERTION N/A 06/24/2020   Procedure: INTRAUTERINE DEVICE (IUD) INSERTION WITH MIRENA;  Surgeon: Theresia Majors, MD;  Location: [facility];  Service: Gynecology;  Laterality: N/A;  Mirena IUD insertion/covered under medical plan.   LAPAROSCOPIC ASSISTED VENTRAL HERNIA REPAIR N/A 10/16/2019   Procedure: LAPAROSCOPIC ASSISTED VENTRAL HERNIA REPAIR WITH MESH;  Surgeon: Luretha Murphy, MD;  Location: [facility];  Service: General;  Laterality: N/A;   NM MISC PROCEDURE  2018,2019   miscarried    PELVIC LAPAROSCOPY     x 4 for endometriosis   REDUCTION MAMMAPLASTY     2009    Current Outpatient Medications  Medication Sig Dispense Refill   Aspirin-Acetaminophen-Caffeine (PAMPRIN MAX PO) Take by mouth.     chlorthalidone (HYGROTON) 25 MG tablet Take 1 tablet (25 mg total) by  mouth daily. 90 tablet 1   Cholecalciferol (VITAMIN D) 125 MCG (5000 UT) CAPS Take 1 capsule by mouth daily. 30 capsule 0   FLUoxetine (PROZAC) 10 MG capsule TAKE 1 CAPSULE BY MOUTH EVERY DAY FOR 90 DAYS     FLUoxetine (PROZAC) 20 MG capsule Take 20 mg by mouth daily.     levonorgestrel (MIRENA) 20 MCG/24HR IUD 1 each by Intrauterine route once.     medroxyPROGESTERone (PROVERA) 10 MG tablet Take 3 tablets (30 mg) by mouth daily. 90 tablet 0   metFORMIN (GLUCOPHAGE XR) 500 MG 24 hr tablet Take 1 tablet (500 mg total) by mouth daily with breakfast. 30 tablet 0   Multiple Vitamin (MULTIVITAMIN PO) Take 30 mLs by mouth daily. Source of life     SUMAtriptan (IMITREX) 100 MG tablet Take 1 tablet (100 mg total) by mouth once as needed for up to 1 dose. May repeat in 2 hours if headache persists or recurs. 10 tablet 12   verapamil (VERELAN PM) 180 MG 24 hr capsule Take by mouth.     vitamin B-12 (CYANOCOBALAMIN) 100 MCG tablet Take 500 mcg by mouth daily.     No current facility-administered medications for this visit.     ALLERGIES: Vicodin [hydrocodone-acetaminophen] and Sulfa antibiotics  Family History  Problem Relation Age of Onset   Migraines Mother    CAD Father    Hypertension Father    Stroke Father    Sleep apnea Father    Migraines Maternal Grandmother    Migraines Sister     Social History   Socioeconomic History   Marital status: Single    Spouse name: Not on file   Number of children: 0   Years of education: Not on file   Highest education level: Master's degree (e.g., MA, MS, MEng, MEd, MSW, MBA)  Occupational History   Occupation: HR Manager  Tobacco Use   Smoking status: Never   Smokeless tobacco: Never  Vaping Use   Vaping Use: Never used  Substance and Sexual Activity   Alcohol use: Yes    Comment: occas.   Drug use: Never   Sexual activity: Yes    Birth control/protection: I.U.D.    Comment: 1st intercourse 40yo-Fewer than 5 partners  Other Topics  Concern   Not on file  Social History Narrative   Lives at home alone   Right handed   Caffeine: 1 cup/day   Social Determinants of Health   Financial Resource Strain: Not on file  Food Insecurity: Not on file  Transportation Needs: Not on file  Physical Activity: Not on file  Stress: Not on file  Social Connections: Not on file  Intimate Partner Violence: Not on file    Review of Systems  All other systems reviewed and are negative.  PHYSICAL EXAMINATION:    BP 124/80   Pulse 88   Ht 5\' 2"  (1.575 m)   Wt 200 lb (90.7 kg)   LMP  (LMP Unknown)   SpO2 98%   BMI 36.58 kg/m     General appearance: alert, cooperative and appears stated age  Pelvic Uterus 7.54 x 5.04 x 3.96 cm.  EMS 1.98 mm.  No myometrial masses.  IUD in endometrial canal.  Right and left ovaries normal.  Right ovarian follicle 14.8 x 8.4 mm.  No adnexal masses.  No free fluid.  ASSESSMENT  Mirena IUD.  No evidence of pelvic congestion.  Hx stage IV endometriosis.  Hx depression.  BMI 36.5.  PLAN  [place] images and report reviewed.   No sign of overt endometriosis.  She will continue with the Mirena IUD.  Stop Provera.  Continue counseling.  Referral to [facility] in [place], [place]. She has declined [place].  Information on Mediterranean diet.  Fu prn.    An After Visit Summary was printed and given to the patient.  32 min  total time was spent for this patient encounter, including preparation, face-to-face counseling with the patient, coordination of care, and documentation of the encounter.

## 2021-08-21 DIAGNOSIS — F4321 Adjustment disorder with depressed mood: Secondary | ICD-10-CM | POA: Diagnosis not present

## 2021-08-22 ENCOUNTER — Telehealth: Payer: Self-pay | Admitting: Obstetrics and Gynecology

## 2021-08-22 NOTE — Telephone Encounter (Signed)
Please make a referral for patient to the [facility] in [place], [place].  Patient has history of chronic pelvic pain and history of stage IV endometriosis.   She lives in [place] and wants to find an endometriosis center closer to where she lives and works.

## 2021-08-26 NOTE — Telephone Encounter (Signed)
Left message for patient to call. Referral has been faxed to [phone]. I asked patient to call so I can let her know this has been done and provider her with the number [phone] to have on hand for [facility] pain clinic as well.

## 2021-09-01 ENCOUNTER — Telehealth: Payer: Self-pay | Admitting: *Deleted

## 2021-09-01 NOTE — Telephone Encounter (Signed)
Patient aware of the below, reports she received a e-mail from [facility], states she will call me and let me know once scheduled.

## 2021-09-01 NOTE — Telephone Encounter (Signed)
Patient called stating her office at her job will be moving to a new building and they are not allowed to take their office chair,unless provided with a doctor's note. 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 She asked if you would be willing to provide her with this note?  Please advise

## 2021-09-02 ENCOUNTER — Encounter: Payer: Self-pay | Admitting: *Deleted

## 2021-09-02 NOTE — Telephone Encounter (Signed)
Ok for letter on [facility] letterhead to be sent to patient through My Chart as:  To Whom It May Concern:   Ms. Angelica Young, date of birth 1981/05/10, has contacted me requesting a letter of support for her to continue with the use her her current office chair at her new office location.  She is an established patient, and she states that this chair helps her with her chronic pain, for which she has been seen and evaluated.   Thank you for your consideration of this employee's request.   Sincerely,   Janean Sark, MD

## 2021-09-02 NOTE — Telephone Encounter (Signed)
Letter sent via my chart. Left detailed message on voicemail this has been done.

## 2021-09-08 ENCOUNTER — Encounter: Payer: Self-pay | Admitting: Obstetrics and Gynecology

## 2021-09-08 DIAGNOSIS — F4321 Adjustment disorder with depressed mood: Secondary | ICD-10-CM | POA: Diagnosis not present

## 2021-09-08 NOTE — Telephone Encounter (Signed)
Patient scheduled on Feb. 23rd at 8:30am.

## 2021-09-22 DIAGNOSIS — F4321 Adjustment disorder with depressed mood: Secondary | ICD-10-CM | POA: Diagnosis not present

## 2021-10-07 DIAGNOSIS — Z20822 Contact with and (suspected) exposure to covid-19: Secondary | ICD-10-CM | POA: Diagnosis not present

## 2021-10-09 DIAGNOSIS — H1089 Other conjunctivitis: Secondary | ICD-10-CM | POA: Diagnosis not present

## 2021-12-26 ENCOUNTER — Telehealth: Payer: Self-pay

## 2021-12-26 ENCOUNTER — Other Ambulatory Visit: Payer: Self-pay

## 2021-12-26 DIAGNOSIS — R87612 Low grade squamous intraepithelial lesion on cytologic smear of cervix (LGSIL): Secondary | ICD-10-CM

## 2021-12-26 DIAGNOSIS — B9689 Other specified bacterial agents as the cause of diseases classified elsewhere: Secondary | ICD-10-CM

## 2021-12-26 MED ORDER — METRONIDAZOLE 500 MG PO TABS: 500.0000 mg | ORAL_TABLET | Freq: Two times a day (BID) | ORAL | 0 refills | Status: AC

## 2021-12-26 NOTE — Telephone Encounter (Signed)
FYI. Scheduled for 01/13/22. Will send in Rx for flagyl per pt's preference.  ?

## 2021-12-26 NOTE — Telephone Encounter (Signed)
Pt notified and voiced understanding. Will send msg to scheduling to set up for next available. Once the appt is made, I will send in Rx for flagyl. ?

## 2021-12-26 NOTE — Telephone Encounter (Signed)
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  ?She also has signs of bacterial vaginosis on her pap ? ?We can check to see about availability for doing the colposcopy sooner.  ? ?IF she has the colposcopy here, THEN I would treat her for BV before that can be done.  ?She may treat with Flagyl 500 mg po bid for 7 days or Metrogel pv at hs for 5 nights.  ?(She would need to complete the gel at least one week prior to the colpo being done, so there is no residual medication in the vagina.) ?  ?If she is not having the colposcopy done here, then her GYN in [place] will need to determine if they want to treat BV  ? ?

## 2021-12-26 NOTE — Telephone Encounter (Signed)
TW pt. Last AEX 12/19/20. Pt reports since then she had moved to [place], [place] and saw GYN there this year for her annual exam. Reports that her pap test came back abnl. 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 However, she states that she couldn't get her f/u/colpo scheduled until 02/2022 and is wondering if she could possibly have it done here at an earlier date if availability allows. Please advise. Thanks.  ?

## 2022-01-13 ENCOUNTER — Ambulatory Visit: Payer: BC Managed Care – PPO | Admitting: Obstetrics and Gynecology

## 2022-01-13 ENCOUNTER — Encounter: Payer: Self-pay | Admitting: Obstetrics and Gynecology

## 2022-01-13 ENCOUNTER — Other Ambulatory Visit (HOSPITAL_COMMUNITY)
Admission: RE | Admit: 2022-01-13 | Discharge: 2022-01-13 | Disposition: A | Discharge status: Home / Self Care | Payer: Self-pay | Source: Ambulatory Visit | Attending: Obstetrics and Gynecology | Admitting: Obstetrics and Gynecology

## 2022-01-13 VITALS — BP 124/80 | Ht 62.0 in | Wt 200.0 lb

## 2022-01-13 DIAGNOSIS — R87810 Cervical high risk human papillomavirus (HPV) DNA test positive: Secondary | ICD-10-CM

## 2022-01-13 DIAGNOSIS — R87612 Low grade squamous intraepithelial lesion on cytologic smear of cervix (LGSIL): Secondary | ICD-10-CM

## 2022-01-13 DIAGNOSIS — Z01818 Encounter for other preprocedural examination: Secondary | ICD-10-CM

## 2022-01-13 LAB — PREGNANCY, URINE: Preg Test, Ur: NEGATIVE

## 2022-01-13 NOTE — Progress Notes (Signed)
GYNECOLOGY  VISIT ?  ?HPI: ?41 y.o.   Single  African American  female   ?[id number] with Patient's last menstrual period was 01/10/2022.   ?here for  colposcopy.  ? ?She just treated for BV due to pap showing BV changes.  ?She took Flagyl 500 mg po bid x 7 days.  ? ?Her pap at [facility] showed LGSIL and positive HR HPV.  ? ?UPT negative.  ? ?GYNECOLOGIC HISTORY: ?Patient's last menstrual period was 01/10/2022. ?Contraception:  IUD - Mirena. ?Menopausal hormone therapy:  n/a ?Last mammogram:  12/25/2020  BI-RADS CATEGORY  1: Negative. ?Last pap smear:  11/28/2019 negative.    ?       ?OB History   ? ? Gravida  ?2  ? Para  ?   ? Term  ?   ? Preterm  ?   ? AB  ?2  ? Living  ?0  ?  ? ? SAB  ?2  ? IAB  ?   ? Ectopic  ?   ? Multiple  ?   ? Live Births  ?   ?   ?  ?  ?    ? ?Patient Active Problem List  ? Diagnosis Date Noted  ? Vitamin D deficiency 12/10/2020  ? History of anemia 12/10/2020  ? Pure hypercholesterolemia 12/10/2020  ? Low serum ferritin level 08/01/2020  ? Elevated alanine aminotransferase (ALT) level 08/01/2020  ? Prediabetes, Dx June 2020 05/16/2020  ? Class 1 obesity with serious comorbidity and body mass index (BMI) of 34.0 to 34.9 in adult 05/16/2020  ? Chronic migraine without aura without status migrainosus, not intractable 02/12/2020  ? Migraine with aura and without status migrainosus, not intractable 02/12/2020  ? Essential hypertension   ? Depression   ? Endometriosis 2014  ? ? ?Past Medical History:  ?Diagnosis Date  ? Abnormal uterine bleeding (AUB)   ? LAST 7 WEEKS  ? Complication of anesthesia   ? SLOW TO AWAKEN AT TIMES  ? Difficult intravenous access   ? LAST 6 OF 7 SURGERIES, STUCK X 3 ON 10-16-2019 SURGERY  ? Endometriosis 2014  ? Hypertension   ? Infertility, female   ? Migraines   ? PONV (postoperative nausea and vomiting)   ? NAUSEA MOSTLY VOMITING X 2 SURGERIES  ? Pre-diabetes   ? Skin abnormality   ? HIZRADENITIS SUPRIVITA  ? Sleep apnea   ? MILD NO CPAP USED IN  4 YEARS  ? Vitamin D  deficiency   ? ? ?Past Surgical History:  ?Procedure Laterality Date  ? BREAST SURGERY    ? reduction 2009  ? DILATION AND CURETTAGE OF UTERUS    ? HERNIA REPAIR  2011  ? UMBILICAL  ? HYSTEROSCOPY WITH D & C N/A 06/24/2020  ? Procedure: DILATATION AND CURETTAGE /HYSTEROSCOPY; POLYPECTOMY;  Surgeon: Theresia Majors, MD;  Location: [facility];  Service: Gynecology;  Laterality: N/A;  request to follow at 10am in time held for Dr. Penni Bombard. ?requests 30 minutes  ? INTRAUTERINE DEVICE (IUD) INSERTION N/A 06/24/2020  ? Procedure: INTRAUTERINE DEVICE (IUD) INSERTION WITH MIRENA;  Surgeon: Theresia Majors, MD;  Location: [facility];  Service: Gynecology;  Laterality: N/A;  Mirena IUD insertion/covered under medical plan.  ? LAPAROSCOPIC ASSISTED VENTRAL HERNIA REPAIR N/A 10/16/2019  ? Procedure: LAPAROSCOPIC ASSISTED VENTRAL HERNIA REPAIR WITH MESH;  Surgeon: Luretha Murphy, MD;  Location: [facility];  Service: General;  Laterality: N/A;  ? NM MISC PROCEDURE  [phone]  ? miscarried   ?  PELVIC LAPAROSCOPY    ? x 4 for endometriosis  ? REDUCTION MAMMAPLASTY    ? 2009  ? ? ?Current Outpatient Medications  ?Medication Sig Dispense Refill  ? Aspirin-Acetaminophen-Caffeine (PAMPRIN MAX PO) Take by mouth.    ? chlorthalidone (HYGROTON) 25 MG tablet Take 1 tablet (25 mg total) by mouth daily. 90 tablet 1  ? Cholecalciferol (VITAMIN D) 125 MCG (5000 UT) CAPS Take 1 capsule by mouth daily. 30 capsule 0  ? FLUoxetine (PROZAC) 20 MG capsule Take 20 mg by mouth daily.    ? levonorgestrel (MIRENA) 20 MCG/24HR IUD 1 each by Intrauterine route once.    ? Multiple Vitamin (MULTIVITAMIN PO) Take 30 mLs by mouth daily. Source of life    ? SUMAtriptan (IMITREX) 100 MG tablet Take 1 tablet (100 mg total) by mouth once as needed for up to 1 dose. May repeat in 2 hours if headache persists or recurs. 10 tablet 12  ? verapamil (VERELAN PM) 180 MG 24 hr capsule Take by mouth.    ? vitamin B-12 (CYANOCOBALAMIN) 100 MCG  tablet Take 500 mcg by mouth daily.    ? ?No current facility-administered medications for this visit.  ?  ? ?ALLERGIES: Vicodin [hydrocodone-acetaminophen] and Sulfa antibiotics ? ?Family History  ?Problem Relation Age of Onset  ? Migraines Mother   ? CAD Father   ? Hypertension Father   ? Stroke Father   ? Sleep apnea Father   ? Migraines Maternal Grandmother   ? Migraines Sister   ? ? ?Social History  ? ?Socioeconomic History  ? Marital status: Single  ?  Spouse name: Not on file  ? Number of children: 0  ? Years of education: Not on file  ? Highest education level: Master's degree (e.g., MA, MS, MEng, MEd, MSW, MBA)  ?Occupational History  ? Occupation: HR Manager  ?Tobacco Use  ? Smoking status: Never  ? Smokeless tobacco: Never  ?Vaping Use  ? Vaping Use: Never used  ?Substance and Sexual Activity  ? Alcohol use: Yes  ?  Comment: occas.  ? Drug use: Never  ? Sexual activity: Not Currently  ?  Birth control/protection: I.U.D.  ?  Comment: 1st intercourse 41yo-Fewer than 5 partners  ?Other Topics Concern  ? Not on file  ?Social History Narrative  ? Lives at home alone  ? Right handed  ? Caffeine: 1 cup/day  ? ?Social Determinants of Health  ? ?Financial Resource Strain: Not on file  ?Food Insecurity: Not on file  ?Transportation Needs: Not on file  ?Physical Activity: Not on file  ?Stress: Not on file  ?Social Connections: Not on file  ?Intimate Partner Violence: Not on file  ? ? ?Review of Systems  See HPI. ? ?PHYSICAL EXAMINATION:   ? ?BP 124/80 (BP Location: Left Arm, Patient Position: Sitting, Cuff Size: Large)   Ht 5\' 2"  (1.575 m)   Wt 200 lb (90.7 kg)   LMP 01/10/2022   BMI 36.58 kg/m?     ?General appearance: alert, cooperative and appears stated age ? ?Colposcopy of cervix and vagina.  ?Consent for procedure.  ?3% acetic acid used.  ?Incandescent and green light filter used. ?Colposcopy satisfactory.  ?IUD strings noted.  ?Thin rim of acetowhite change of cervix at 5:00.  ?No vaginal lesions noted.   ?ECC and biopsy at 5:00 taken and sent to pathology separately.  ?Silver nitrate and Monsel's placed on cervix.  ?EBL 5 cc.  ?No complications.  ?Mirena IUD respected during procedure.  ?  ?  Chaperone was present for exam:  Kimalexis, CMA  ? ?ASSESSMENT ? ?Pap LGSIL and positive HR HPV. ?Mirena IUD.  ? ?PLAN ? ?Fu colposcopy biopsies.  ?I mentioned LEEP procedure if moderate or high grade dysplasia are noted.  ?  ?An After Visit Summary was printed and given to the patient. ? ?  ? ? ?

## 2022-01-13 NOTE — Patient Instructions (Signed)
Colposcopy, Care After ?The following information offers guidance on how to care for yourself after your procedure. Your health care provider may also give you more specific instructions. If you have problems or questions, contact your health care provider. ?What can I expect after the procedure? ?If you had a colposcopy without a biopsy, you can expect to feel fine right away after your procedure. However, you may have some spotting of blood for a few days. You can return to your normal activities. ?If you had a colposcopy with a biopsy, it is common after the procedure to have: ?Soreness and mild pain. These may last for a few days. ?Mild vaginal bleeding or discharge that is dark-colored and grainy. This may last for a few days. The discharge may be caused by a liquid (solution) that was used during the procedure. You may need to wear a sanitary pad during this time. ?Spotting of blood for at least 48 hours after the procedure. ?Follow these instructions at home: ?Medicines ?Take over-the-counter and prescription medicines only as told by your health care provider. ?Talk with your health care provider about what type of over-the-counter pain medicines and prescription medicines you can start to take again. It is especially important to talk with your health care provider if you take blood thinners. ?Activity ?Avoid using douche products, using tampons, and having sex for at least 3 days after the procedure or for as long as told by your health care provider. ?Return to your normal activities as told by your health care provider. Ask your health care provider what activities are safe for you. ?General instructions ?Ask your health care provider if you may take baths, swim, or use a hot tub. You may take showers. ?If you use birth control (contraception), continue to use it. ?Keep all follow-up visits. This is important. ?Contact a health care provider if: ?You have a fever or chills. ?You faint or feel  light-headed. ?Get help right away if: ?You have heavy bleeding from your vagina or pass blood clots. Heavy bleeding is bleeding that soaks through a sanitary pad in less than 1 hour. ?You have vaginal discharge that is abnormal, is yellow in color, or smells bad. This could be a sign of infection. ?You have severe pain or cramps in your lower abdomen that do not go away with medicine. ?Summary ?If you had a colposcopy without a biopsy, you can expect to feel fine right away, but you may have some spotting of blood for a few days. You can return to your normal activities. ?If you had a colposcopy with a biopsy, it is common to have mild pain for a few days and spotting for 48 hours after the procedure. ?Avoid using douche products, using tampons, and having sex for at least 3 days after the procedure or for as long as told by your health care provider. ?Get help right away if you have heavy bleeding, severe pain, or signs of infection. ?This information is not intended to replace advice given to you by your health care provider. Make sure you discuss any questions you have with your health care provider. ?Document Revised: 02/23/2021 Document Reviewed: 02/23/2021 ?Elsevier Patient Education ? 2022 [employer] ? ?

## 2022-01-15 LAB — SURGICAL PATHOLOGY

## 2022-01-22 ENCOUNTER — Telehealth: Payer: Self-pay | Admitting: *Deleted

## 2022-01-22 NOTE — Telephone Encounter (Signed)
Patient informed recall placed.  ?

## 2022-01-22 NOTE — Telephone Encounter (Signed)
Colposcopic biopsy showed LGSIL.  ?No high grade dysplasia or malignancy were seen.  ?No treatment is needed.  ? ?She will need a pap and HR HPV testing in 12 months.  ?Please enter this recall.  ? ?See also result note. ?

## 2022-01-22 NOTE — Telephone Encounter (Signed)
Patient called requesting biopsy results from 01/13/22 visit. Please advise  ?

## 2022-05-20 ENCOUNTER — Encounter (INDEPENDENT_AMBULATORY_CARE_PROVIDER_SITE_OTHER): Payer: Self-pay
